# Patient Record
Sex: Female | Born: 1990 | Race: Black or African American | Hispanic: No | Marital: Single | Smoking: Never smoker
Health system: Southern US, Community
[De-identification: ages and names within clinical notes are randomized; demographics above are authoritative.]

## PROBLEM LIST (undated history)

## (undated) ENCOUNTER — Inpatient Hospital Stay (HOSPITAL_COMMUNITY): Payer: Self-pay

## (undated) DIAGNOSIS — T7840XA Allergy, unspecified, initial encounter: Secondary | ICD-10-CM

## (undated) DIAGNOSIS — G43909 Migraine, unspecified, not intractable, without status migrainosus: Secondary | ICD-10-CM

## (undated) DIAGNOSIS — N83209 Unspecified ovarian cyst, unspecified side: Secondary | ICD-10-CM

## (undated) HISTORY — PX: WISDOM TOOTH EXTRACTION: SHX21

## (undated) HISTORY — DX: Allergy, unspecified, initial encounter: T78.40XA

---
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
## 2008-05-12 ENCOUNTER — Observation Stay (HOSPITAL_COMMUNITY): Admission: EM | Admit: 2008-05-12 | Discharge: 2008-05-13 | Payer: Self-pay | Admitting: Emergency Medicine

## 2008-05-17 ENCOUNTER — Observation Stay (HOSPITAL_COMMUNITY): Admission: AD | Admit: 2008-05-17 | Discharge: 2008-05-18 | Payer: Self-pay | Admitting: Obstetrics & Gynecology

## 2009-09-20 ENCOUNTER — Inpatient Hospital Stay (HOSPITAL_COMMUNITY): Admission: AD | Admit: 2009-09-20 | Discharge: 2009-09-21 | Payer: Self-pay | Admitting: Obstetrics & Gynecology

## 2010-02-16 IMAGING — CT CT ABDOMEN W/ CM
1 of 3 series · 14 of 32 positions shown, 19 images · IV contrast (agent unspecified)
Comparison: None available.

CT ABDOMEN

CLINICAL DATA: Right side abdominal pain, nausea and vomiting.

CT ABDOMEN AND PELVIS WITH CONTRAST
TECHNIQUE: Multidetector CT imaging of the abdomen and pelvis was
performed using the standard protocol following bolus
administration of intravenous contrast.
Contrast: 100 ml Qmnipaque-0DD.

[Series 2: abd_pel 5.0 b40f st · axial · 0.59mm/px · z∈[-486,-116]mm · 14 of 84 slices shown, 19 images]
[im 5/84  soft-tissue]
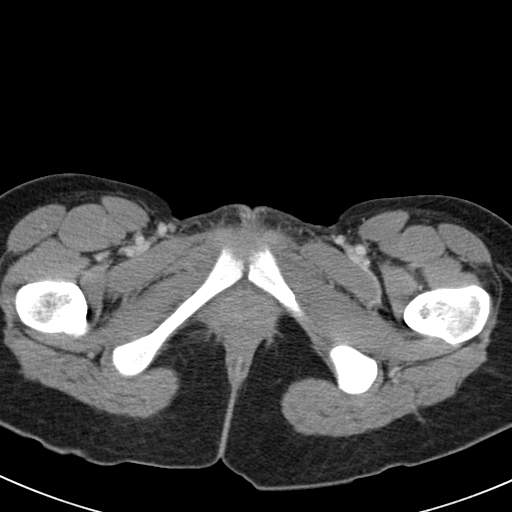
[im 5/84  bone]
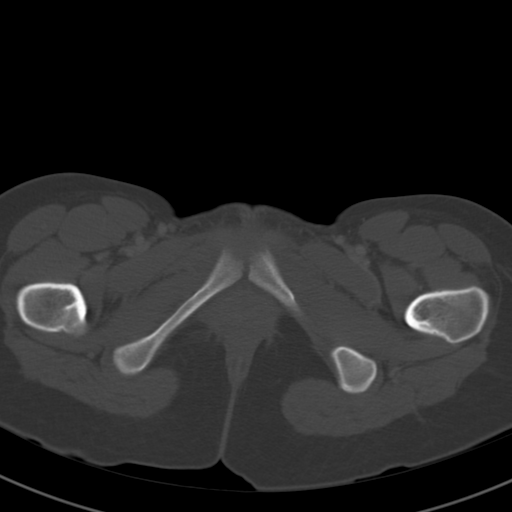
[im 14/84  soft-tissue]
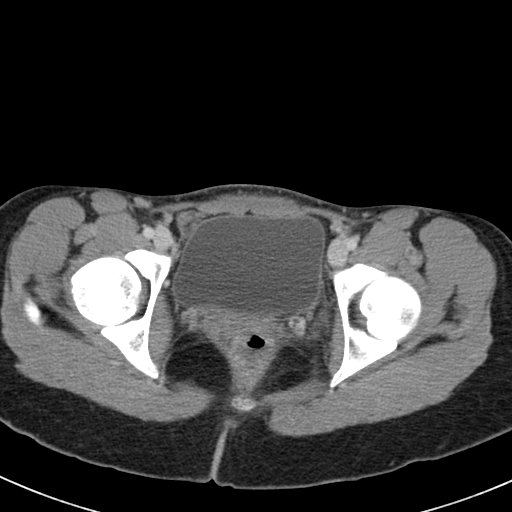
[im 18/84  soft-tissue]
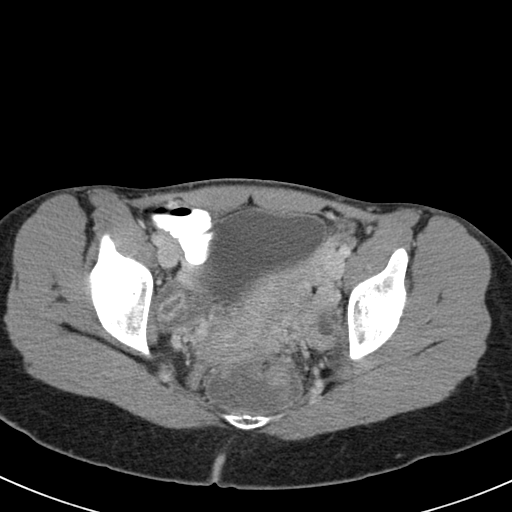
[im 22/84  soft-tissue]
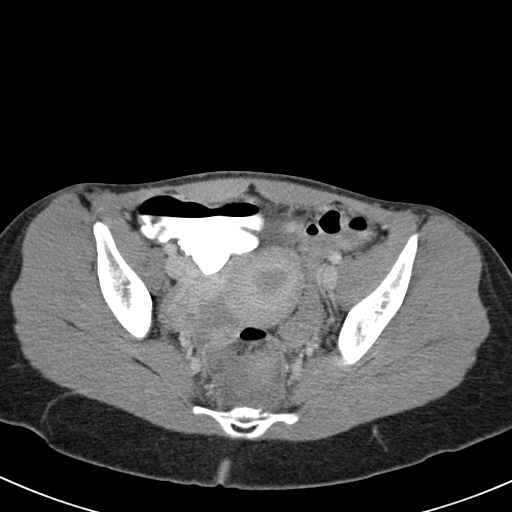
[im 31/84  soft-tissue]
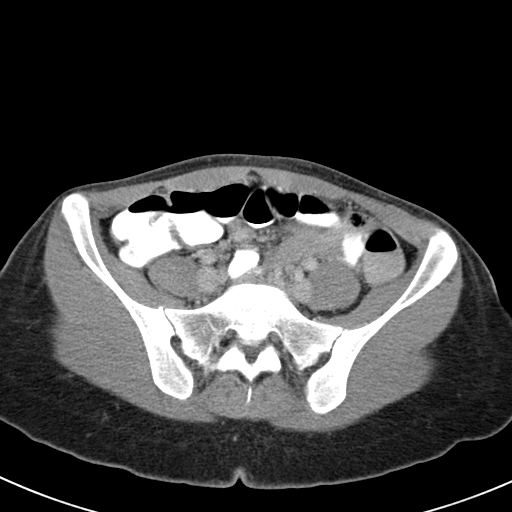
[im 35/84  soft-tissue]
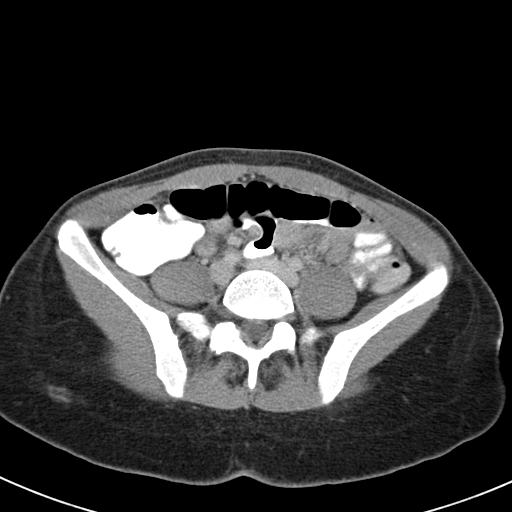
[im 44/84  soft-tissue]
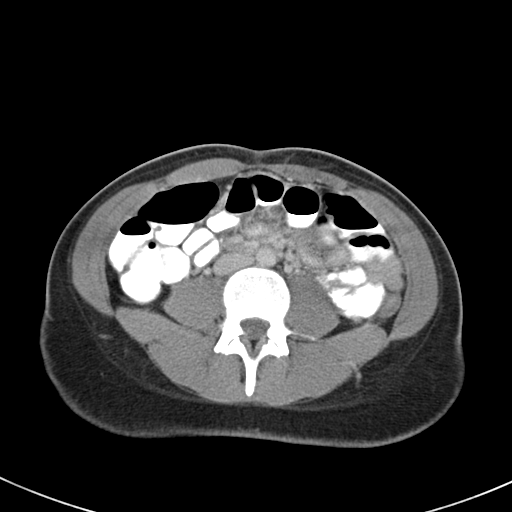
[im 49/84  soft-tissue]
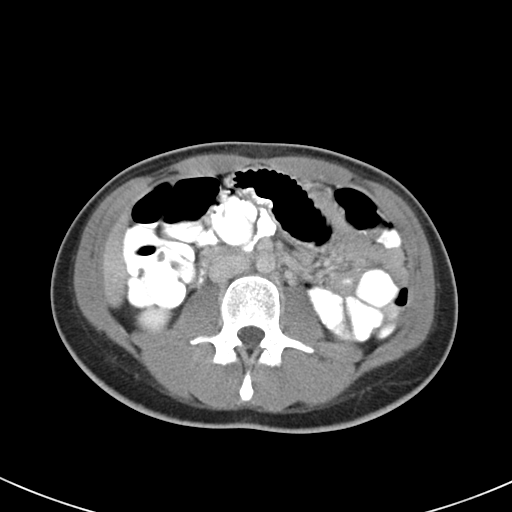
[im 53/84  soft-tissue]
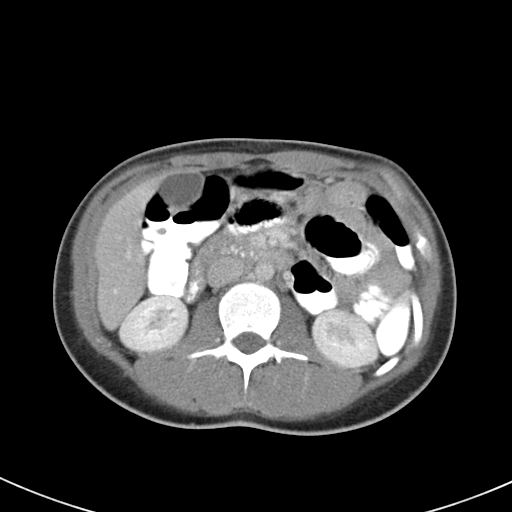
[im 53/84  bone]
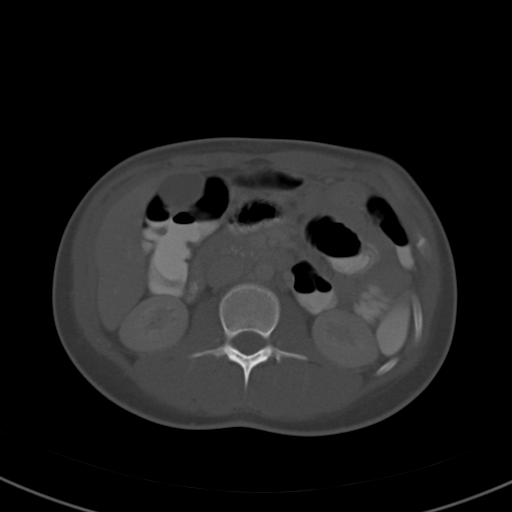
[im 62/84  soft-tissue]
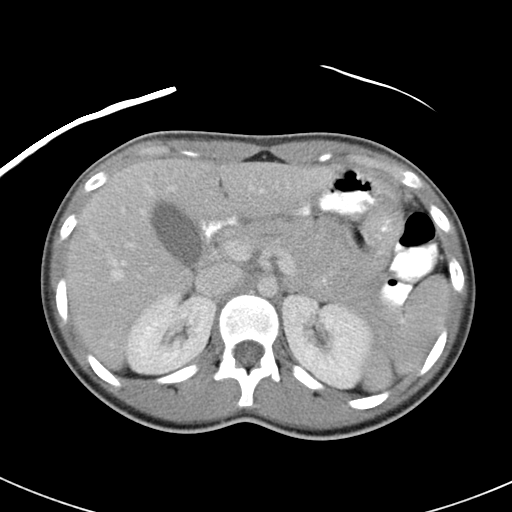
[im 66/84  soft-tissue]
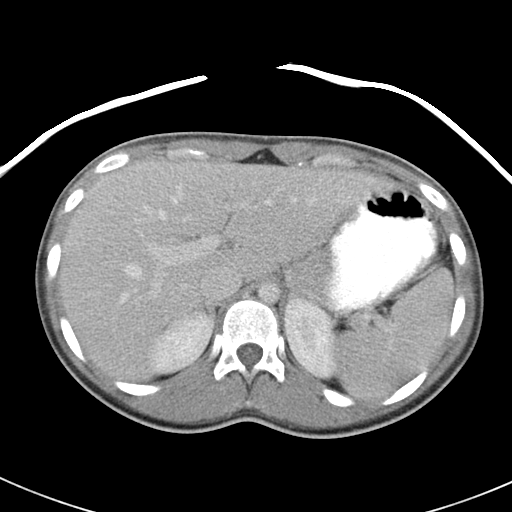
[im 66/84  lung]
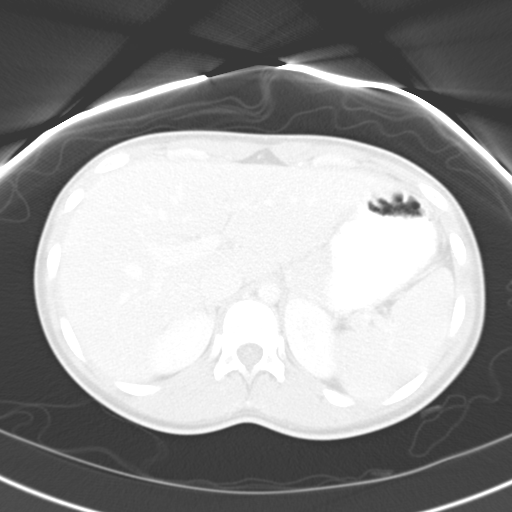
[im 70/84  soft-tissue]
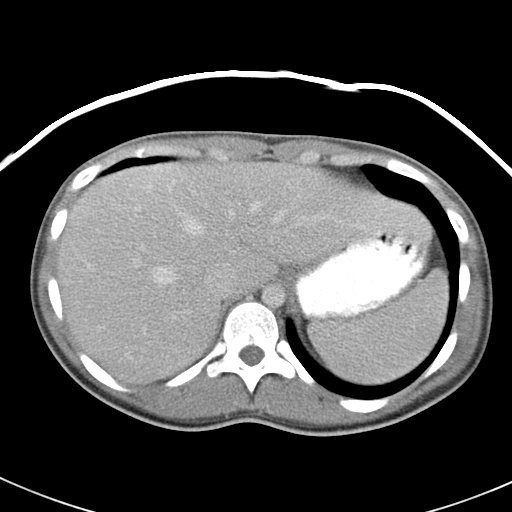
[im 70/84  lung]
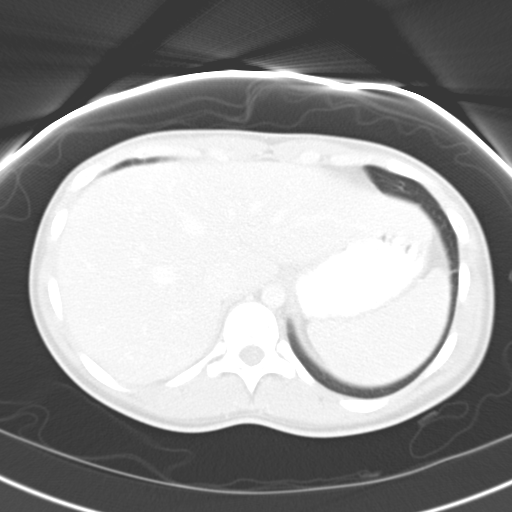
[im 75/84  lung]
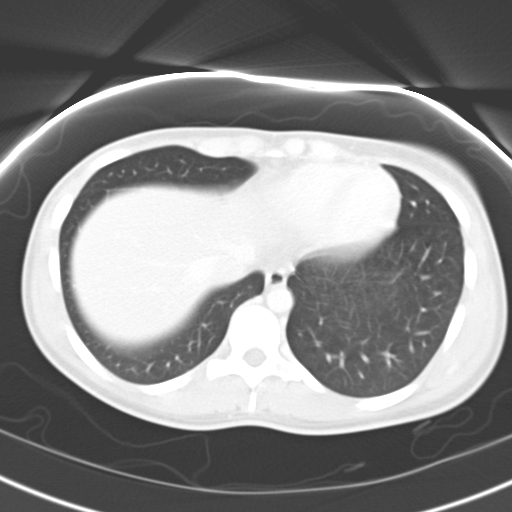
[im 79/84  soft-tissue]
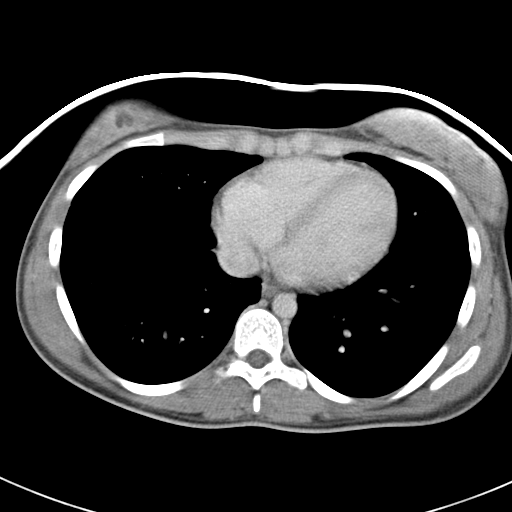
[im 79/84  lung]
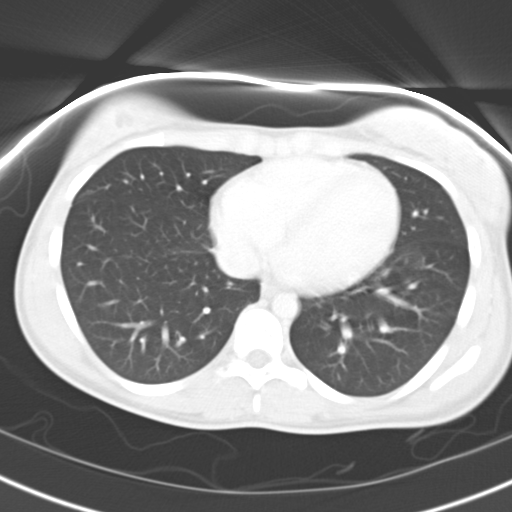

[14 of 32 positions shown; findings below may reference images not displayed]

FINDINGS: The lung bases are clear.  There is no pleural or
pericardial effusion.  The liver, gallbladder, biliary tree,
adrenal glands, spleen, pancreas and kidneys all appear normal.  No
lymphadenopathy or fluid.  No focal bony abnormality.
IMPRESSION: Negative abdomen CT scan.

CT PELVIS
FINDINGS: The appendix is partially visualized and appears normal.
The patient has a right ovarian cyst with an ill-defined
hyperattenuating ring within compatible with involution.  Left
ovary and uterus appear normal. The colon is fluid-filled but
otherwise unremarkable.  There is no free pelvic fluid and no
pelvic lymphadenopathy.  No focal bony abnormality.
IMPRESSION: 1.  Findings compatible with an involuting right ovarian cyst.
2.  Negative for appendicitis.
3.  Fluid filled colon is noted and compatible with diarrhea.

## 2010-08-01 LAB — URINALYSIS, ROUTINE W REFLEX MICROSCOPIC
Bilirubin Urine: NEGATIVE
Bilirubin Urine: NEGATIVE
Ketones, ur: >80 mg/dL — AB
Nitrite: NEGATIVE
Specific Gravity, Urine: 1.01 (ref 1.005–1.030)
Specific Gravity, Urine: 1.005 (ref 1.005–1.030)
Urobilinogen, UA: 1 mg/dL (ref 0.0–1.0)
pH: 8.5 — ABNORMAL HIGH (ref 5.0–8.0)

## 2010-08-01 LAB — BASIC METABOLIC PANEL
BUN: 5 mg/dL — ABNORMAL LOW (ref 6–23)
CO2: 23 mEq/L (ref 19–32)
Chloride: 104 mEq/L (ref 96–112)
Potassium: 3.4 mEq/L — ABNORMAL LOW (ref 3.5–5.1)

## 2010-08-01 LAB — COMPREHENSIVE METABOLIC PANEL
ALT: 18 U/L (ref 0–35)
AST: 24 U/L (ref 0–37)
Albumin: 3.5 g/dL (ref 3.5–5.2)
Alkaline Phosphatase: 52 U/L (ref 39–117)
BUN: 9 mg/dL (ref 6–23)
CO2: 18 mEq/L — ABNORMAL LOW (ref 19–32)
Calcium: 9.1 mg/dL (ref 8.4–10.5)
Chloride: 102 mEq/L (ref 96–112)
Creatinine, Ser: 0.65 mg/dL (ref 0.4–1.2)
GFR calc Af Amer: >60 mL/min (ref >60)
GFR calc non Af Amer: >60 mL/min (ref >60)
Glucose, Bld: 100 mg/dL — ABNORMAL HIGH (ref 70–99)
Potassium: 3.3 mEq/L — ABNORMAL LOW (ref 3.5–5.1)
Sodium: 132 mEq/L — ABNORMAL LOW (ref 135–145)
Total Bilirubin: 1.3 mg/dL — ABNORMAL HIGH (ref 0.3–1.2)
Total Bilirubin: 0.6 mg/dL (ref 0.3–1.2)
Total Protein: 7.4 g/dL (ref 6.0–8.3)

## 2010-08-01 LAB — DIFFERENTIAL
Basophils Absolute: 0 10*3/uL (ref 0.0–0.1)
Basophils Absolute: 0.1 10*3/uL (ref 0.0–0.1)
Basophils Relative: 0 % (ref 0–1)
Basophils Relative: 0 % (ref 0–1)
Eosinophils Absolute: 0 10*3/uL (ref 0.0–0.7)
Eosinophils Absolute: 0 10*3/uL (ref 0.0–0.7)
Eosinophils Relative: 0 % (ref 0–5)
Eosinophils Relative: 0 % (ref 0–5)
Lymphocytes Relative: 12 % (ref 12–46)
Lymphs Abs: 1.1 10*3/uL (ref 0.7–4.0)
Lymphs Abs: 2.1 10*3/uL (ref 0.7–4.0)
Lymphs Abs: 2.1 10*3/uL (ref 0.7–4.0)
Monocytes Absolute: 0.7 10*3/uL (ref 0.1–1.0)
Monocytes Relative: 3 % (ref 3–12)
Monocytes Relative: 4 % (ref 3–12)
Neutro Abs: 14 10*3/uL — ABNORMAL HIGH (ref 1.7–7.7)
Neutrophils Relative %: 87 % — ABNORMAL HIGH (ref 43–77)

## 2010-08-01 LAB — WET PREP, GENITAL: Trich, Wet Prep: NONE SEEN

## 2010-08-01 LAB — CBC
HCT: 32.4 % — ABNORMAL LOW (ref 36.0–46.0)
HCT: 35.7 % — ABNORMAL LOW (ref 36.0–46.0)
Hemoglobin: 12.5 g/dL (ref 12.0–15.0)
MCHC: 33 g/dL (ref 30.0–36.0)
MCHC: 33.6 g/dL (ref 30.0–36.0)
MCHC: 33.5 g/dL (ref 30.0–36.0)
MCV: 90.2 fL (ref 78.0–100.0)
MCV: 89.1 fL (ref 78.0–100.0)
Platelets: 169 10*3/uL (ref 150–400)
Platelets: 273 10*3/uL (ref 150–400)
RBC: 3.59 MIL/uL — ABNORMAL LOW (ref 3.87–5.11)
RBC: 4.18 MIL/uL (ref 3.87–5.11)
RDW: 13.5 % (ref 11.5–15.5)
WBC: 13.1 10*3/uL — ABNORMAL HIGH (ref 4.0–10.5)
WBC: 19.4 10*3/uL — ABNORMAL HIGH (ref 4.0–10.5)

## 2010-08-01 LAB — LIPASE, BLOOD: Lipase: 25 U/L (ref 11–59)

## 2010-08-01 LAB — URINE MICROSCOPIC-ADD ON

## 2010-08-01 LAB — GC/CHLAMYDIA PROBE AMP, GENITAL: Chlamydia, DNA Probe: NEGATIVE

## 2010-08-01 LAB — PREGNANCY, URINE: Preg Test, Ur: NEGATIVE

## 2010-08-02 LAB — DIFFERENTIAL
Eosinophils Relative: 0 % (ref 0–5)
Lymphocytes Relative: 9 % — ABNORMAL LOW (ref 12–46)
Lymphs Abs: 1.5 10*3/uL (ref 0.7–4.0)
Monocytes Absolute: 0.8 10*3/uL (ref 0.1–1.0)
Monocytes Relative: 5 % (ref 3–12)

## 2010-08-02 LAB — RPR: RPR Ser Ql: NONREACTIVE

## 2010-08-02 LAB — CBC
HCT: 30.2 % — ABNORMAL LOW (ref 36.0–46.0)
Hemoglobin: 10.1 g/dL — ABNORMAL LOW (ref 12.0–15.0)
RDW: 13.4 % (ref 11.5–15.5)
WBC: 16.4 10*3/uL — ABNORMAL HIGH (ref 4.0–10.5)

## 2010-09-02 NOTE — Op Note (Signed)
[facility]  Patient:    Kristy Davis, Kristy Davis                     MRN: [id number] Adm. Date:  [id number] Attending:  Kendell Bane CC:         Moshe Salisbury. Audria Nine, M.D.   Operative Report  PREOPERATIVE DIAGNOSIS:  Volar ganglion left wrist.  POSTOPERATIVE DIAGNOSIS:  Volar ganglion left wrist.  PROCEDURE:  Excision of volar ganglion left wrist.  SURGEON:  Lowell Bouton, M.D.  ANESTHESIA:  General.  OPERATIVE FINDINGS:  The patient had a 1.5 cm volar ganglion that arose along the scaphoid tuberosity and the flexor carpi radialis sheath.  DESCRIPTION OF PROCEDURE:  Under general anesthesia, with a tourniquet on the left arm, the left hand was prepped and draped in the usual fashion and after exsanguinating the limb the tourniquet was inflated to 225 mmHg.  A V-shaped incision was made over the volar-radial aspect of the left wrist.  Sharp dissection was carried through the subcutaneous tissues and bleeding points were coagulated.  Blunt dissection was carried down to the mass and the mass was dissected out bluntly.  After completely dissecting out the mass from the flexor carpi radialis sheath, the stalk was transected sharply.  After removing the ganglion, the stalk was curetted out and was found to go down along the scaphoid tuberosity.  The wound was then irrigated copiously with saline.  A Vesi-loop drain was left in for drainage and the skin was closed with a 4-0 subcuticular Prolene.  Steri-Strips were applied, followed by sterile dressings and a volar wrist splint.  The tourniquet was released with good circulation of the hand.  The patient went to the recovery awake and stable and in good condition. DD:  10/17/00 TD:  10/17/00 Job: [id number]

## 2010-11-23 ENCOUNTER — Inpatient Hospital Stay (HOSPITAL_COMMUNITY)
Admission: AD | Admit: 2010-11-23 | Discharge: 2010-11-23 | Disposition: A | Discharge status: Home / Self Care | Payer: 59 | Source: Ambulatory Visit | Attending: Obstetrics and Gynecology | Admitting: Obstetrics and Gynecology

## 2010-11-23 ENCOUNTER — Inpatient Hospital Stay (HOSPITAL_COMMUNITY): Payer: 59

## 2010-11-23 DIAGNOSIS — N7011 Chronic salpingitis: Secondary | ICD-10-CM | POA: Diagnosis present

## 2010-11-23 DIAGNOSIS — R1031 Right lower quadrant pain: Secondary | ICD-10-CM | POA: Insufficient documentation

## 2010-11-23 DIAGNOSIS — N7013 Chronic salpingitis and oophoritis: Secondary | ICD-10-CM

## 2010-11-23 LAB — POCT PREGNANCY, URINE: Preg Test, Ur: NEGATIVE

## 2010-11-23 LAB — WET PREP, GENITAL
Clue Cells Wet Prep HPF POC: NONE SEEN
Trich, Wet Prep: NONE SEEN
Yeast Wet Prep HPF POC: NONE SEEN

## 2010-11-23 LAB — URINALYSIS, ROUTINE W REFLEX MICROSCOPIC
Bilirubin Urine: NEGATIVE
Glucose, UA: NEGATIVE mg/dL
Ketones, ur: NEGATIVE mg/dL
Leukocytes, UA: NEGATIVE
Nitrite: NEGATIVE
Protein, ur: NEGATIVE mg/dL
Specific Gravity, Urine: 1.025 (ref 1.005–1.030)
Urobilinogen, UA: 2 mg/dL — ABNORMAL HIGH (ref 0.0–1.0)
pH: 6 (ref 5.0–8.0)

## 2010-11-23 LAB — CBC
HCT: 35.5 % — ABNORMAL LOW (ref 36.0–46.0)
Hemoglobin: 11.7 g/dL — ABNORMAL LOW (ref 12.0–15.0)
MCH: 29.4 pg (ref 26.0–34.0)
MCHC: 33 g/dL (ref 30.0–36.0)
MCV: 89.2 fL (ref 78.0–100.0)
Platelets: 201 10*3/uL (ref 150–400)
RBC: 3.98 MIL/uL (ref 3.87–5.11)
RDW: 12.9 % (ref 11.5–15.5)
WBC: 6 10*3/uL (ref 4.0–10.5)

## 2010-11-23 LAB — URINE MICROSCOPIC-ADD ON

## 2010-11-23 MED ORDER — HYDROCODONE-ACETAMINOPHEN 5-500 MG PO TABS: 1.0000 | ORAL_TABLET | Freq: Four times a day (QID) | ORAL | Status: AC | PRN

## 2010-11-23 NOTE — Progress Notes (Signed)
Patient states pain started last night when she had a bowel movement.  Right upper quadrant abdomen with sharp pains that started today, constant.

## 2010-11-23 NOTE — ED Provider Notes (Signed)
History     Chief Complaint  Patient presents with  . Abdominal Pain   HPI 20 y.o. with RLQ pain, constant since last night, started with BM, sharp. No vaginal bleeding or discharge. Normal BMs, no n/v. No fever, chills, malaise.   OB History    No data available      No past medical history on file.  No past surgical history on file.  No family history on file.  History  Substance Use Topics  . Smoking status: Not on file  . Smokeless tobacco: Not on file  . Alcohol Use: Not on file    Allergies: No Known Allergies  No prescriptions prior to admission    Review of Systems  Constitutional: Negative.   Respiratory: Negative.   Cardiovascular: Negative.   Gastrointestinal: Positive for abdominal pain. Negative for nausea, vomiting, diarrhea and constipation.  Genitourinary: Negative for dysuria, urgency, frequency, hematuria and flank pain.       Negative for vaginal bleeding, vaginal discharge, positive for pain  Musculoskeletal: Negative.   Neurological: Negative.   Psychiatric/Behavioral: Negative.    Physical Exam   Blood pressure 114/72, pulse 72, temperature 98.2 F (36.8 C), resp. rate 16, height 5\' 3"  (1.6 m), weight 53.797 kg (118 lb 9.6 oz), last menstrual period 11/15/2010.  Physical Exam  Constitutional: She is oriented to person, place, and time. She appears well-developed and well-nourished. No distress.  HENT:  Head: Normocephalic and atraumatic.  Cardiovascular: Normal rate, regular rhythm and normal heart sounds.   Respiratory: Effort normal and breath sounds normal. No respiratory distress.  GI: Soft. Bowel sounds are normal. She exhibits no distension and no mass. There is no tenderness. There is no rebound and no guarding.  Genitourinary: There is no rash or lesion on the right labia. There is no rash or lesion on the left labia. Uterus is not deviated, not enlarged, not fixed and not tender. Cervix exhibits no motion tenderness, no discharge  and no friability. Right adnexum displays mass and tenderness. Right adnexum displays no fullness. Left adnexum displays no mass, no tenderness and no fullness. No erythema, tenderness or bleeding around the vagina. Vaginal discharge (creamy white) found.  Neurological: She is alert and oriented to person, place, and time.  Skin: Skin is warm and dry.  Psychiatric: She has a normal mood and affect.    MAU Course  Procedures US Transvaginal Non-ob  11/23/2010  *RADIOLOGY REPORT*  Clinical Data: Right adnexal tenderness/pain.  TRANSABDOMINAL AND TRANSVAGINAL ULTRASOUND OF PELVIS Technique:  Both transabdominal and transvaginal ultrasound examinations of the pelvis were performed. Transabdominal technique was performed for global imaging of the pelvis including uterus, ovaries, adnexal regions, and pelvic cul-de-sac.  Comparison: None.   It was necessary to proceed with endovaginal exam following the transabdominal exam to visualize the endometrium.  Findings:  Uterus: Normal in size and appearance, measuring 6.1 x 3.3 x 4.1 cm  Endometrium: Normal in thickness and appearance, measuring 6 mm  Right ovary:  Normal appearance/no adnexal mass, measuring 1.8 x 2.6 x 2.4 cm.  Adjacent dilated tubular structure, suspicious for hydrosalpinx.  Left ovary: Normal appearance/no adnexal mass, measuring 3.3 x 1.5 x 2.1 cm  Other findings: Small volume free fluid  IMPRESSION: Dilated tubular structure in the right adnexa, suspicious for hydrosalpinx.  Otherwise normal sonographic appearance of the uterus and bilateral ovaries.  Original Report Authenticated By: Charline Bills, M.D.   Consult with Dr. Jolayne Panther - rec pain mgmt and f/u in GYN clinic in 1  month.   After discussing with pt, pt revealed that she is a patient at [facility], where she sees Arlana Lindau, NP, and she would like to follow up there  Assessment and Plan  20 y.o. with right hydrosalpinx Rx vicodin #30 Call [facility] for  follow-up   Coree Riester 11/23/2010, 10:47 PM

## 2010-11-23 NOTE — Progress Notes (Signed)
Patient c/o suprapubic pain has had history of UTI, no vaginal discharge, LMP 11/15/10

## 2010-11-29 NOTE — ED Provider Notes (Signed)
Agree with above note.  Kristy Davis 11/29/2010 8:45 AM

## 2011-03-11 ENCOUNTER — Inpatient Hospital Stay (HOSPITAL_COMMUNITY): Payer: 59

## 2011-03-11 ENCOUNTER — Encounter (HOSPITAL_COMMUNITY): Payer: Self-pay | Admitting: *Deleted

## 2011-03-11 ENCOUNTER — Inpatient Hospital Stay (HOSPITAL_COMMUNITY)
Admission: AD | Admit: 2011-03-11 | Discharge: 2011-03-11 | Disposition: A | Discharge status: Home / Self Care | Payer: 59 | Source: Ambulatory Visit | Attending: Obstetrics | Admitting: Obstetrics

## 2011-03-11 DIAGNOSIS — R109 Unspecified abdominal pain: Secondary | ICD-10-CM | POA: Insufficient documentation

## 2011-03-11 LAB — URINE MICROSCOPIC-ADD ON

## 2011-03-11 LAB — URINALYSIS, ROUTINE W REFLEX MICROSCOPIC
Ketones, ur: >80 mg/dL — AB
Nitrite: NEGATIVE
Protein, ur: NEGATIVE mg/dL
Urobilinogen, UA: 1 mg/dL (ref 0.0–1.0)

## 2011-03-11 LAB — POCT PREGNANCY, URINE: Preg Test, Ur: NEGATIVE

## 2011-03-11 MED ORDER — METRONIDAZOLE 500 MG PO TABS: 500.0000 mg | ORAL_TABLET | Freq: Two times a day (BID) | ORAL | Status: AC

## 2011-03-11 MED ORDER — OXYCODONE-ACETAMINOPHEN 5-325 MG PO TABS: 1.0000 | ORAL_TABLET | ORAL | Status: AC | PRN

## 2011-03-11 NOTE — Progress Notes (Signed)
G 0. LMP 02/16/2011. Pain lower abd since Weds. Denies vag bleeding or d/c. Some nausea but no emesis or diarrhea. Crampy pains in abd with intermittent sharp pains in abd and back

## 2011-03-11 NOTE — Progress Notes (Signed)
Dr Ernestina Penna notified of pt's admission and status. Aware of neg upt and u/a results. Will get pelvic u/s and urine for culture.

## 2011-03-11 NOTE — H&P (Signed)
CC: abd pain HPI: 20 yo NP pt w/ abdominal pain since Wed. Got worse last night and decided to come for eval. LMP 11/6, normal per pt. No VB at this time, no vag d/c, no fevers, no emesis, no nausea at this time (has nausea earlier when decided to come in). Has not taken anything for pain (no OTC pain meds). No aggravating/ alleviating factors. Pt does say moving makes pain worse. Pt states last BM yest and thinks she may be constipation. Pt notes sharp pain in back with urination. Pt states she has had UTI in past but this feels different. Sexually active, condoms for birth control. Pt states pain is both constant and comes and goes.  All: none Meds: none PMH: none PSH: none  PE:  Filed Vitals:   03/11/11 0203  BP: 119/65  Pulse: 112  Temp: 98.8 F (37.1 C)  TempSrc: Oral  Resp: 18  Height: 5' 3.25" (1.607 m)  Weight: 52.844 kg (116 lb 8 oz)   Gen: overall well w/ no distress though at times w/ movement holds abdomen CV:RRR,, no longer tachycardic Pulm: CTAB Back: no CVAT Abd: mild tenderness throughout, no rebound, no guarding LE: NT, no edema GU: nl cvx, no d/c, no bleeding, L adnexal tenderness and mobile cystic 4 cm mass, no true CMT though pt w/ discomfort during entire pelvic exam, no bladder tenderness  Wet prep: numerous clue cells noted, no lactobacilli, no yeast, 5 WBC/ hpf  UA: contaminated  U/s : 3.6 cm L hemorrhagic cyst  A/P: Abdominal pain, acute in nature but not w/ a surgical abdomen, likely from hemorrhagic cyst - Hemorrhagic cyst. D/w pt. Plan po pain meds at home and re-eval in 1 wk. D/w pt importance of returning w/ worsening pain, si/sx of anemia, increased N/V. Can consider OC for cyst suppression in this sexually active pt using condoms. UCx and GC/CT pending.  - BV. Flagyl given  Kristy Davis A. 03/11/2011 8:08 AM

## 2011-03-11 NOTE — Progress Notes (Signed)
Pt states, " I 've had low abdominal pain since Wed evening. It first started off like gas, and I took gas pills since Thursday night and it didn't work."

## 2011-03-12 LAB — URINE CULTURE
Colony Count: 6000
Culture  Setup Time: 201211241106

## 2011-03-13 LAB — GC/CHLAMYDIA PROBE AMP, GENITAL: GC Probe Amp, Genital: NEGATIVE

## 2011-11-02 ENCOUNTER — Ambulatory Visit (INDEPENDENT_AMBULATORY_CARE_PROVIDER_SITE_OTHER): Payer: 59 | Admitting: Family Medicine

## 2011-11-02 VITALS — BP 124/68 | HR 83 | Temp 98.0°F | Resp 18 | Ht 63.0 in | Wt 124.0 lb

## 2011-11-02 DIAGNOSIS — R05 Cough: Secondary | ICD-10-CM

## 2011-11-02 DIAGNOSIS — J069 Acute upper respiratory infection, unspecified: Secondary | ICD-10-CM

## 2011-11-02 DIAGNOSIS — J3489 Other specified disorders of nose and nasal sinuses: Secondary | ICD-10-CM

## 2011-11-02 MED ORDER — AMOXICILLIN 875 MG PO TABS: 875.0000 mg | ORAL_TABLET | Freq: Two times a day (BID) | ORAL | Status: AC

## 2011-11-02 NOTE — Progress Notes (Signed)
Subjective:    Patient ID: Kristy Davis, female    DOB: 01-03-1991, 21 y.o.   MRN: [id number]  HPI Kristy Davis is a 21 y.o. female Sinus pressure, and pain in whole head.  Ears clogged, nose stuffed, neck/glands sore.  No sore throat.  No fever. Face feels hot. Some cough - yellow at times with sputum.    [employer], infant room.  Nonsmoker.   Generic allergy med - otc - took one.   Review of Systems Per hpi.     Objective:   Physical Exam  Constitutional: She is oriented to person, place, and time. She appears well-developed and well-nourished. No distress.  HENT:  Head: Normocephalic and atraumatic.  Right Ear: Hearing, tympanic membrane, external ear and ear canal normal.  Left Ear: Hearing, tympanic membrane, external ear and ear canal normal.  Nose: Right sinus exhibits no maxillary sinus tenderness and no frontal sinus tenderness. Left sinus exhibits maxillary sinus tenderness. Left sinus exhibits no frontal sinus tenderness.  Mouth/Throat: Oropharynx is clear and moist. No oropharyngeal exudate.  Eyes: Conjunctivae and EOM are normal. Pupils are equal, round, and reactive to light.  Neck: Normal range of motion.  Cardiovascular: Normal rate, regular rhythm, normal heart sounds and intact distal pulses.   No murmur heard. Pulmonary/Chest: Effort normal and breath sounds normal. No respiratory distress. She has no wheezes. She has no rhonchi.  Lymphadenopathy:    She has no cervical adenopathy.  Neurological: She is alert and oriented to person, place, and time.  Skin: Skin is warm and dry. No rash noted.  Psychiatric: She has a normal mood and affect. Her behavior is normal.          Assessment & Plan:  Kristy Davis is a 21 y.o. female 1. Sinus pressure  amoxicillin (AMOXIL) 875 MG tablet  2. URI (upper respiratory infection)    3. Cough     Likely viral URI with sinus pressure.  Sx care as below and rtc precautions.  Patient Instructions    Saline nasal spray atleast 4 times per day, over the counter mucinex or mucinex DM, drink plenty of fluids. Wash hands frequently at work, avoid close contact and touching face. If sinus pressure/congestion not improving in the next 7 to 10 days, can start antibiotic for a possible sinus infection. Return to the clinic or go to the nearest emergency room if any of your symptoms worsen or new symptoms occur.   Upper Respiratory Infection, Adult An upper respiratory infection (URI) is also known as the common cold. It is often caused by a type of germ (virus). Colds are easily spread (contagious). You can pass it to others by kissing, coughing, sneezing, or drinking out of the same glass. Usually, you get better in 1 or 2 weeks.  HOME CARE   Only take medicine as told by your doctor.   Use a warm mist humidifier or breathe in steam from a hot shower.   Drink enough water and fluids to keep your pee (urine) clear or pale yellow.   Get plenty of rest.   Return to work when your temperature is back to normal or as told by your doctor. You may use a face mask and wash your hands to stop your cold from spreading.  GET HELP RIGHT AWAY IF:   After the first few days, you feel you are getting worse.   You have questions about your medicine.   You have chills, shortness of breath,  or brown or red spit (mucus).   You have yellow or brown snot (nasal discharge) or pain in the face, especially when you bend forward.   You have a fever, puffy (swollen) neck, pain when you swallow, or white spots in the back of your throat.   You have a bad headache, ear pain, sinus pain, or chest pain.   You have a high-pitched whistling sound when you breathe in and out (wheezing).   You have a lasting cough or cough up blood.   You have sore muscles or a stiff neck.  MAKE SURE YOU:   Understand these instructions.   Will watch your condition.   Will get help right away if you are not doing well or get  worse.  Document Released: 09/20/2007 Document Revised: 03/23/2011 Document Reviewed: 08/08/2010 [facility] Patient Information 2012 [place], [place].

## 2011-11-02 NOTE — Patient Instructions (Signed)
Saline nasal spray atleast 4 times per day, over the counter mucinex or mucinex DM, drink plenty of fluids. Wash hands frequently at work, avoid close contact and touching face. If sinus pressure/congestion not improving in the next 7 to 10 days, can start antibiotic for a possible sinus infection. Return to the clinic or go to the nearest emergency room if any of your symptoms worsen or new symptoms occur.   Upper Respiratory Infection, Adult An upper respiratory infection (URI) is also known as the common cold. It is often caused by a type of germ (virus). Colds are easily spread (contagious). You can pass it to others by kissing, coughing, sneezing, or drinking out of the same glass. Usually, you get better in 1 or 2 weeks.  HOME CARE   Only take medicine as told by your doctor.   Use a warm mist humidifier or breathe in steam from a hot shower.   Drink enough water and fluids to keep your pee (urine) clear or pale yellow.   Get plenty of rest.   Return to work when your temperature is back to normal or as told by your doctor. You may use a face mask and wash your hands to stop your cold from spreading.  GET HELP RIGHT AWAY IF:   After the first few days, you feel you are getting worse.   You have questions about your medicine.   You have chills, shortness of breath, or brown or red spit (mucus).   You have yellow or brown snot (nasal discharge) or pain in the face, especially when you bend forward.   You have a fever, puffy (swollen) neck, pain when you swallow, or white spots in the back of your throat.   You have a bad headache, ear pain, sinus pain, or chest pain.   You have a high-pitched whistling sound when you breathe in and out (wheezing).   You have a lasting cough or cough up blood.   You have sore muscles or a stiff neck.  MAKE SURE YOU:   Understand these instructions.   Will watch your condition.   Will get help right away if you are not doing well or get  worse.  Document Released: 09/20/2007 Document Revised: 03/23/2011 Document Reviewed: 08/08/2010 [facility] Patient Information 2012 [place], [place].

## 2012-02-09 ENCOUNTER — Emergency Department (HOSPITAL_COMMUNITY)
Admission: EM | Admit: 2012-02-09 | Discharge: 2012-02-09 | Disposition: A | Discharge status: Home / Self Care | Payer: 59 | Attending: Emergency Medicine | Admitting: Emergency Medicine

## 2012-02-09 ENCOUNTER — Encounter (HOSPITAL_COMMUNITY): Payer: Self-pay | Admitting: Emergency Medicine

## 2012-02-09 ENCOUNTER — Emergency Department (HOSPITAL_COMMUNITY): Payer: 59

## 2012-02-09 DIAGNOSIS — R079 Chest pain, unspecified: Secondary | ICD-10-CM | POA: Insufficient documentation

## 2012-02-09 LAB — BASIC METABOLIC PANEL
Calcium: 8.5 mg/dL (ref 8.4–10.5)
GFR calc Af Amer: >90 mL/min (ref >90)
GFR calc non Af Amer: >90 mL/min (ref >90)
Glucose, Bld: 90 mg/dL (ref 70–99)
Potassium: 3.8 mEq/L (ref 3.5–5.1)
Sodium: 135 mEq/L (ref 135–145)

## 2012-02-09 LAB — CBC
Hemoglobin: 11.5 g/dL — ABNORMAL LOW (ref 12.0–15.0)
MCHC: 33 g/dL (ref 30.0–36.0)
Platelets: 222 10*3/uL (ref 150–400)
RDW: 12.7 % (ref 11.5–15.5)

## 2012-02-09 LAB — POCT I-STAT TROPONIN I: Troponin i, poc: 0.01 ng/mL (ref 0.00–0.08)

## 2012-02-09 LAB — POCT PREGNANCY, URINE: Preg Test, Ur: NEGATIVE

## 2012-02-09 NOTE — ED Notes (Addendum)
Pt reports having L side CP (under L breast towards sternum) X1 month; denies SOB, n/v; denies pain currently

## 2012-02-09 NOTE — ED Provider Notes (Signed)
History     CSN: [id number]  Arrival date & time 02/09/12  8295   First MD Initiated Contact with Patient 02/09/12 2006      Chief Complaint  Patient presents with  . Chest Pain    (Consider location/radiation/quality/duration/timing/severity/associated sxs/prior treatment) HPI... chest pain for approximately one month, described as sharp. Pain is located in the inferior medial left chest area, is intermittent, 2 episodes per day, each lasting about 1 minute. No dyspnea, diaphoresis, nausea. No smoking. No family history. No alcohol. Nothing makes symptoms better or worse  Past Medical History  Diagnosis Date  . No pertinent past medical history     Past Surgical History  Procedure Date  . No past surgeries     History reviewed. No pertinent family history.  History  Substance Use Topics  . Smoking status: Never Smoker   . Smokeless tobacco: Not on file  . Alcohol Use: Yes     occasion    OB History    Grav Para Term Preterm Abortions TAB SAB Ect Mult Living   0               Review of Systems  All other systems reviewed and are negative.    Allergies  Review of patient's allergies indicates no known allergies.  Home Medications   Current Outpatient Rx  Name Route Sig Dispense Refill  . NORGESTIM-ETH ESTRAD TRIPHASIC 0.18/0.215/0.25 MG-25 MCG PO TABS Oral Take 1 tablet by mouth daily.      BP 112/64  Pulse 71  Temp 97.3 F (36.3 C) (Oral)  Resp 20  SpO2 100%  LMP 01/03/2012  Physical Exam  Nursing note and vitals reviewed. Constitutional: She is oriented to person, place, and time. She appears well-developed and well-nourished.  HENT:  Head: Normocephalic and atraumatic.  Eyes: Conjunctivae normal and EOM are normal. Pupils are equal, round, and reactive to light.  Neck: Normal range of motion. Neck supple.  Cardiovascular: Normal rate, regular rhythm and normal heart sounds.   Pulmonary/Chest: Effort normal and breath sounds normal.    Abdominal: Soft. Bowel sounds are normal.  Musculoskeletal: Normal range of motion.  Neurological: She is alert and oriented to person, place, and time.  Skin: Skin is warm and dry.  Psychiatric: She has a normal mood and affect.    ED Course  Procedures (including critical care time)  Labs Reviewed  CBC - Abnormal; Notable for the following:    Hemoglobin 11.5 (*)     HCT 34.9 (*)     All other components within normal limits  BASIC METABOLIC PANEL  POCT PREGNANCY, URINE  POCT I-STAT TROPONIN I   Dg Chest 2 View  02/09/2012  *RADIOLOGY REPORT*  Clinical Data: Chest pain  CHEST - 2 VIEW  Comparison: 05/12/2008  Findings: Heart size appears normal.  No pleural effusion or edema. No airspace consolidation.  IMPRESSION:  1.  No active cardiopulmonary abnormalities.   Original Report Authenticated By: Rosealee Albee, M.D.     Date: 02/09/2012  Rate: 68  Rhythm: normal sinus rhythm  QRS Axis: right  Intervals: normal  ST/T Wave abnormalities: normal  Conduction Disutrbances: none  Narrative Interpretation: unremarkable     1. Chest pain       MDM  Patient is low risk for acute coronary syndrome or pulmonary embolus. Screening tests negative.        Donnetta Hutching, MD 02/09/12 2213

## 2012-05-24 ENCOUNTER — Emergency Department (HOSPITAL_COMMUNITY)
Admission: EM | Admit: 2012-05-24 | Discharge: 2012-05-24 | Disposition: A | Discharge status: Home / Self Care | Payer: 59 | Attending: Emergency Medicine | Admitting: Emergency Medicine

## 2012-05-24 ENCOUNTER — Encounter (HOSPITAL_COMMUNITY): Payer: Self-pay | Admitting: Emergency Medicine

## 2012-05-24 DIAGNOSIS — Z79899 Other long term (current) drug therapy: Secondary | ICD-10-CM | POA: Insufficient documentation

## 2012-05-24 DIAGNOSIS — G43909 Migraine, unspecified, not intractable, without status migrainosus: Secondary | ICD-10-CM | POA: Insufficient documentation

## 2012-05-24 DIAGNOSIS — H53149 Visual discomfort, unspecified: Secondary | ICD-10-CM | POA: Insufficient documentation

## 2012-05-24 DIAGNOSIS — R110 Nausea: Secondary | ICD-10-CM | POA: Insufficient documentation

## 2012-05-24 DIAGNOSIS — R51 Headache: Secondary | ICD-10-CM | POA: Insufficient documentation

## 2012-05-24 HISTORY — DX: Migraine, unspecified, not intractable, without status migrainosus: G43.909

## 2012-05-24 MED ORDER — DIPHENHYDRAMINE HCL 50 MG/ML IJ SOLN
25.0000 mg | Freq: Once | INTRAMUSCULAR | Status: AC
  Administered 2012-05-24: 25 mg via INTRAVENOUS
  Filled 2012-05-24: qty 1

## 2012-05-24 MED ORDER — KETOROLAC TROMETHAMINE 30 MG/ML IJ SOLN
30.0000 mg | Freq: Once | INTRAMUSCULAR | Status: AC
  Administered 2012-05-24: 30 mg via INTRAVENOUS
  Filled 2012-05-24: qty 1

## 2012-05-24 MED ORDER — METOCLOPRAMIDE HCL 5 MG/ML IJ SOLN
10.0000 mg | Freq: Once | INTRAMUSCULAR | Status: AC
  Administered 2012-05-24: 10 mg via INTRAVENOUS
  Filled 2012-05-24: qty 2

## 2012-05-24 NOTE — ED Provider Notes (Signed)
  Medical screening examination/treatment/procedure(s) were performed by non-physician practitioner and as supervising physician I was immediately available for consultation/collaboration.    Lorianne Malbrough, MD 05/24/12 2354 

## 2012-05-24 NOTE — ED Notes (Signed)
Pt c/o migraine HA in left eye starting today with blurry vision, nausea and photophobia; pt sts hx of migraine

## 2012-05-24 NOTE — ED Provider Notes (Signed)
History     CSN: [id number]  Arrival date & time 05/24/12  1425   First MD Initiated Contact with Patient 05/24/12 1619      Chief Complaint  Patient presents with  . Migraine    (Consider location/radiation/quality/duration/timing/severity/associated sxs/prior treatment) HPI Comments: Patient with a history of Migraines presents today with a chief complaint of a migraine headache.  She reports that the headache feels similar to migraine headaches that she has had in the past.  She reports that the pain began around noon today.  Pain has been persistent.  She took Ibuprofen for her pain, which helped somewhat but she continues to have a headache.  Headache located in the left frontal region.  Headache associated with nausea and photophobia.    Patient is a 22 y.o. female presenting with headaches. The history is provided by the patient.  Headache  The problem occurs constantly. The headache is associated with nothing. The pain is located in the left unilateral region. The quality of the pain is described as throbbing. The pain does not radiate. Associated symptoms include nausea. Pertinent negatives include no fever, no near-syncope, no syncope and no vomiting. Associated symptoms comments: No visual field defects.  No neck pain or stiffness.  No fever or chills..    Past Medical History  Diagnosis Date  . No pertinent past medical history   . Migraine     Past Surgical History  Procedure Date  . No past surgeries     History reviewed. No pertinent family history.  History  Substance Use Topics  . Smoking status: Never Smoker   . Smokeless tobacco: Not on file  . Alcohol Use: Yes     Comment: occasion    OB History    Grav Para Term Preterm Abortions TAB SAB Ect Mult Living   0               Review of Systems  Constitutional: Negative for fever and chills.  HENT: Negative for neck pain and neck stiffness.   Eyes: Positive for photophobia.  Cardiovascular: Negative  for syncope and near-syncope.  Gastrointestinal: Positive for nausea. Negative for vomiting.  Neurological: Positive for headaches.  All other systems reviewed and are negative.    Allergies  Review of patient's allergies indicates no known allergies.  Home Medications   Current Outpatient Rx  Name  Route  Sig  Dispense  Refill  . NORGESTIM-ETH ESTRAD TRIPHASIC 0.18/0.215/0.25 MG-25 MCG PO TABS   Oral   Take 1 tablet by mouth daily.           BP 119/80  Pulse 78  Temp 97.7 F (36.5 C) (Oral)  Resp 18  SpO2 100%  Physical Exam  Nursing note and vitals reviewed. Constitutional: She appears well-developed and well-nourished. No distress.  HENT:  Head: Normocephalic and atraumatic.  Mouth/Throat: Oropharynx is clear and moist.  Eyes: EOM are normal. Pupils are equal, round, and reactive to light.  Neck: Normal range of motion. Neck supple.  Cardiovascular: Normal rate, regular rhythm and normal heart sounds.   Pulmonary/Chest: Effort normal and breath sounds normal.  Musculoskeletal: Normal range of motion.  Neurological: She is alert. She has normal strength. No cranial nerve deficit or sensory deficit. Gait normal.       Normal gait, no ataxia No visual field defect Normal Rapid Alternating Movements Normal finger to nose testing.  Skin: Skin is warm and dry. No rash noted. She is not diaphoretic.  Psychiatric: She has a  normal mood and affect.    ED Course  Procedures (including critical care time)  Labs Reviewed - No data to display No results found.   No diagnosis found.  5:39 PM Reassessed patient.  Patient reports that her headache has resolved at this time.  Will discharge the patient home.  MDM  Pt HA treated and improved while in ED.  Presentation is like pts typical HA and non concerning for [facility], ICH, or Meningitis. Pt is afebrile with no focal neuro deficits, nuchal rigidity, or change in vision. Pt is to follow up with PCP to discuss prophylactic  medication. Pt verbalizes understanding and is agreeable with plan to dc.         Pascal Lux Monroe, PA-C 05/24/12 1746

## 2012-10-15 ENCOUNTER — Ambulatory Visit (INDEPENDENT_AMBULATORY_CARE_PROVIDER_SITE_OTHER): Payer: 59 | Admitting: Emergency Medicine

## 2012-10-15 VITALS — BP 118/73 | HR 81 | Temp 98.0°F | Resp 16 | Ht 63.0 in | Wt 134.0 lb

## 2012-10-15 DIAGNOSIS — J018 Other acute sinusitis: Secondary | ICD-10-CM

## 2012-10-15 DIAGNOSIS — J209 Acute bronchitis, unspecified: Secondary | ICD-10-CM

## 2012-10-15 MED ORDER — HYDROCOD POLST-CHLORPHEN POLST 10-8 MG/5ML PO LQCR: 5.0000 mL | Freq: Two times a day (BID) | ORAL | Status: DC | PRN

## 2012-10-15 MED ORDER — PSEUDOEPHEDRINE-GUAIFENESIN ER 60-600 MG PO TB12: 1.0000 | ORAL_TABLET | Freq: Two times a day (BID) | ORAL | Status: AC

## 2012-10-15 MED ORDER — AMOXICILLIN-POT CLAVULANATE 875-125 MG PO TABS: 1.0000 | ORAL_TABLET | Freq: Two times a day (BID) | ORAL | Status: DC

## 2012-10-15 NOTE — Patient Instructions (Signed)

## 2012-10-15 NOTE — Progress Notes (Signed)
[facility] [address] [place] [address] [phone]  Date:  10/15/2012   Name:  Kristy Davis   DOB:  [birth year]   MRN:  [id number]  PCP:  Karie Chimera, MD    Chief Complaint: Sinusitis and Cough   History of Present Illness:  Kristy Davis is a 22 y.o. very pleasant female patient who presents with the following:  Ill since last Wednesday.  Has nasal congestion and post nasal drainage.  Has purulent drainage.  Sore throat, hoarseness, chills. No fever.  No nausea or vomiting.  No rash or stool change.  Has a new non productive cough.  No improvement with over the counter medications or other home remedies. Denies other complaint or health concern today.   Patient Active Problem List   Diagnosis Date Noted  . Migraine   . 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 No pertinent past medical history   . Migraine     Past Surgical History  Procedure Laterality Date  . No past surgeries      History  Substance Use Topics  . Smoking status: Never Smoker   . Smokeless tobacco: Not on file  . Alcohol Use: Yes     Comment: occasion    History reviewed. No pertinent family history.  No Known Allergies  Medication list has been reviewed and updated.  Current Outpatient Prescriptions on File Prior to Visit  Medication Sig Dispense Refill  . Norgestimate-Ethinyl Estradiol Triphasic (ORTHO TRI-CYCLEN LO) 0.18/0.215/0.25 MG-25 MCG tab Take 1 tablet by mouth daily.       No current facility-administered medications on file prior to visit.    Review of Systems:  As per HPI, otherwise negative.    Physical Examination: Filed Vitals:   10/15/12 1923  BP: 118/73  Pulse: 81  Temp: 98 F (36.7 C)  Resp: 16   Filed Vitals:   10/15/12 1923  Height: 5\' 3"  (1.6 m)  Weight: 134 lb (60.782 kg)   Body mass index is 23.74 kg/(m^2). Ideal Body Weight: Weight in (lb) to have BMI = 25: 140.8  GEN: WDWN, NAD,  Non-toxic, A & O x 3 HEENT: Atraumatic, Normocephalic. Neck supple. No masses, No LAD. Ears and Nose: No external deformity. CV: RRR, No M/G/R. No JVD. No thrill. No extra heart sounds. PULM: CTA B, no wheezes, crackles, rhonchi. No retractions. No resp. distress. No accessory muscle use. ABD: S, NT, ND, +BS. No rebound. No HSM. EXTR: No c/c/e NEURO Normal gait.  PSYCH: Normally interactive. Conversant. Not depressed or anxious appearing.  Calm demeanor.    Assessment and Plan: Sinusitis mucinex d augementin tussionex   Signed,  Phillips Odor, MD

## 2013-07-12 ENCOUNTER — Ambulatory Visit (INDEPENDENT_AMBULATORY_CARE_PROVIDER_SITE_OTHER): Payer: 59 | Admitting: Physician Assistant

## 2013-07-12 VITALS — BP 110/68 | HR 82 | Temp 98.4°F | Resp 16 | Ht 62.25 in | Wt 129.8 lb

## 2013-07-12 DIAGNOSIS — Z0000 Encounter for general adult medical examination without abnormal findings: Secondary | ICD-10-CM

## 2013-07-12 DIAGNOSIS — Z111 Encounter for screening for respiratory tuberculosis: Secondary | ICD-10-CM

## 2013-07-13 NOTE — Progress Notes (Signed)
   Subjective:    Patient ID: Kristy Davis, female    DOB: 01/19/1991, 23 y.o.   MRN: [id number]  HPI 23 year old female here for CPE and TB test.  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 Admits she enjoys it and does plan to continue this type of work.  She is healthy with no known medical problems. Only current daily medication is her OCP's which she is tolerating well.  Does have an annual gynecologic exam with her GYN and also has labwork done there. She does not wish to do any labwork today.      Review of Systems  Constitutional: Negative.   HENT: Negative.   Eyes: Negative.   Respiratory: Negative.   Cardiovascular: Negative.   Gastrointestinal: Negative.   Endocrine: Negative.   Genitourinary: Negative.   Musculoskeletal: Negative.   Skin: Negative.   Allergic/Immunologic: Positive for environmental allergies.  Neurological: Negative.   Hematological: Negative.   Psychiatric/Behavioral: Negative.        Objective:   Physical Exam  Constitutional: She is oriented to person, place, and time. She appears well-developed and well-nourished.  HENT:  Head: Normocephalic and atraumatic.  Right Ear: External ear normal.  Left Ear: External ear normal.  Mouth/Throat: Oropharynx is clear and moist.  Eyes: Conjunctivae and EOM are normal. Pupils are equal, round, and reactive to light.  Neck: Normal range of motion. Neck supple. No thyromegaly present.  Cardiovascular: Normal rate, regular rhythm and normal heart sounds.   Pulmonary/Chest: Effort normal and breath sounds normal.  Abdominal: Soft. Bowel sounds are normal. There is no tenderness. There is no rebound and no guarding.  Musculoskeletal: Normal range of motion.  Lymphadenopathy:    She has no cervical adenopathy.  Neurological: She is alert and oriented to person, place, and time. She has normal strength.  No cranial nerve deficit.  Reflex Scores:      Patellar reflexes are 2+ on the right side and 2+ on the left side. Psychiatric: She has a normal mood and affect. Her behavior is normal. Judgment and thought content normal.          Assessment & Plan:   Routine general medical examination at a health care facility  Screening for tuberculosis - Plan: TB Skin Test  Anticipatory guidance provided.   TB test placed.  Return in 48-72 hours for TB read Continue to f/u with GYN for annual wellness exams

## 2013-07-14 ENCOUNTER — Encounter (INDEPENDENT_AMBULATORY_CARE_PROVIDER_SITE_OTHER): Payer: 59 | Admitting: *Deleted

## 2013-07-14 DIAGNOSIS — Z111 Encounter for screening for respiratory tuberculosis: Secondary | ICD-10-CM

## 2013-07-14 LAB — TB SKIN TEST
Induration: 0 mm
TB Skin Test: NEGATIVE

## 2013-11-15 ENCOUNTER — Ambulatory Visit (INDEPENDENT_AMBULATORY_CARE_PROVIDER_SITE_OTHER): Payer: 59 | Admitting: Internal Medicine

## 2013-11-15 VITALS — BP 102/60 | HR 86 | Temp 98.3°F | Resp 16 | Ht 63.0 in | Wt 128.0 lb

## 2013-11-15 DIAGNOSIS — R05 Cough: Secondary | ICD-10-CM

## 2013-11-15 DIAGNOSIS — J018 Other acute sinusitis: Secondary | ICD-10-CM

## 2013-11-15 DIAGNOSIS — R059 Cough, unspecified: Secondary | ICD-10-CM

## 2013-11-15 MED ORDER — AMOXICILLIN 875 MG PO TABS: 875.0000 mg | ORAL_TABLET | Freq: Two times a day (BID) | ORAL | Status: DC

## 2013-11-15 MED ORDER — HYDROCODONE-HOMATROPINE 5-1.5 MG/5ML PO SYRP: 5.0000 mL | ORAL_SOLUTION | Freq: Four times a day (QID) | ORAL | Status: DC | PRN

## 2013-11-15 NOTE — Progress Notes (Signed)
Subjective:  This chart was scribed for Kristy Siaobert Doolittle, MD by Kristy Davis, Medical Scribe. This patient was seen in Room 13 and the patient's care was started at 9:18 AM.   Patient ID: Kristy Davis, female    DOB: 04/21/1990, 23 y.o.   MRN: [id number]  HPI HPI Comments: Kristy Davis is a 23 y.o. female who [facility] the [facility] complaining of constant sinusitis that started 4 days ago.  She endorses cough and ear pain as associated symptoms.  She denies fever as an associated symptom.  She has not taken any medication for her symptoms.  She suspects that she may have come in contact with sick children at work.  She is not allergic to Penicillin.    Past Medical History  Diagnosis Date   No pertinent past medical history    Migraine    Past Surgical History  Procedure Laterality Date   No past surgeries     No family history on file. History   Social History   Marital Status: Single    Spouse Name: N/A    Number of Children: N/A   Years of Education: N/A   Occupational History   Not on file.   Social History Main Topics   Smoking status: Never Smoker    Smokeless tobacco: Not on file   Alcohol Use: Yes     Comment: occasion   Drug Use: No   Sexual Activity: Yes    Birth Control/ Protection: Condom   Other Topics Concern   Not on file   Social History Narrative   No narrative on file   No Known Allergies  Review of Systems  Constitutional: Negative for fever.  HENT: Positive for ear pain.   Respiratory: Positive for cough.      Objective:  Physical Exam  Nursing note and vitals reviewed. Constitutional: She is oriented to person, place, and time. She appears well-developed and well-nourished. No distress.  HENT:  Head: Normocephalic and atraumatic.  Right Ear: Hearing, tympanic membrane, external ear and ear canal normal.  Left Ear: Hearing, tympanic membrane, external ear and ear canal normal.  Nose:  Rhinorrhea present.  Mouth/Throat: Uvula is midline, oropharynx is clear and moist and mucous membranes are normal.  Purulent mucous in nares.  Eyes: Conjunctivae and EOM are normal. Pupils are equal, round, and reactive to light.  Neck: Neck supple.  Cardiovascular: Normal rate, regular rhythm and normal heart sounds.  Exam reveals no gallop and no friction rub.   No murmur heard. Pulmonary/Chest: Effort normal and breath sounds normal. No respiratory distress. She has no wheezes. She has no rales.  Neurological: She is alert and oriented to person, place, and time. No cranial nerve deficit.  Psychiatric: She has a normal mood and affect. Her behavior is normal.    BP 102/60   Pulse 86   Temp(Src) 98.3 F (36.8 C) (Oral)   Resp 16   Ht 5\' 3"  (1.6 m)   Wt 128 lb (58.06 kg)   BMI 22.68 kg/m2   SpO2 100%   LMP 11/05/2013 Assessment & Plan:    I have completed the patient encounter in its entirety as documented by the scribe, with editing by me where necessary. Kristy Davis, M.D. Other acute sinusitis  Cough  Meds ordered this encounter  Medications   amoxicillin (AMOXIL) 875 MG tablet    Sig: Take 1 tablet (875 mg total) by mouth 2 (two) times daily.  Dispense:  20 tablet    Refill:  0   HYDROcodone-homatropine (HYCODAN) 5-1.5 MG/5ML syrup    Sig: Take 5 mLs by mouth every 6 (six) hours as needed.    Dispense:  120 mL    Refill:  0

## 2014-03-16 ENCOUNTER — Ambulatory Visit: Payer: 59

## 2014-05-23 ENCOUNTER — Ambulatory Visit (INDEPENDENT_AMBULATORY_CARE_PROVIDER_SITE_OTHER): Payer: 59 | Admitting: Physician Assistant

## 2014-05-23 VITALS — BP 118/62 | HR 79 | Temp 98.6°F | Resp 18 | Ht 64.75 in | Wt 129.8 lb

## 2014-05-23 DIAGNOSIS — J069 Acute upper respiratory infection, unspecified: Secondary | ICD-10-CM

## 2014-05-23 DIAGNOSIS — B9789 Other viral agents as the cause of diseases classified elsewhere: Principal | ICD-10-CM

## 2014-05-23 MED ORDER — GUAIFENESIN ER 1200 MG PO TB12: 1.0000 | ORAL_TABLET | Freq: Two times a day (BID) | ORAL | Status: DC | PRN

## 2014-05-23 MED ORDER — BENZONATATE 100 MG PO CAPS: 100.0000 mg | ORAL_CAPSULE | Freq: Three times a day (TID) | ORAL | Status: DC | PRN

## 2014-05-23 MED ORDER — IPRATROPIUM BROMIDE 0.03 % NA SOLN: 2.0000 | Freq: Two times a day (BID) | NASAL | Status: DC

## 2014-05-23 NOTE — Progress Notes (Signed)
Subjective:    Patient ID: Kristy Davis, female    DOB: [birth year], 24 y.o.   MRN: [id number]   PCP: Karie Chimera, MD  Chief Complaint  Patient presents with  . Cough    productive x 2 days     No Known Allergies  Patient Active Problem List   Diagnosis Date Noted  . Migraine   . 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 Authorizing Provider  MONONESSA 0.25-35 MG-MCG tablet  05/16/14   Historical Provider, MD    Medical, Surgical, Family and Social History reviewed and updated.  HPI  Presents, accompanied by her mother, with 2 days of cough productive of yellow sputum. She describes the same color when she blows her nose, which is congested. Ears feel "tight." No fever, chills. No nausea, vomiting, diarrhea. No muscle or joint pain. No rash. 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  Review of Systems As above.    Objective:   Physical Exam  Constitutional: She is oriented to person, place, and time. Vital signs are normal. She appears well-developed and well-nourished. She is active and cooperative. No distress.  Voice is hoarse.  HENT:  Head: Normocephalic and atraumatic.  Right Ear: Hearing, tympanic membrane, external ear and ear canal normal.  Left Ear: Hearing, tympanic membrane, external ear and ear canal normal.  Nose: Mucosal edema present.  No foreign bodies. Right sinus exhibits no maxillary sinus tenderness and no frontal sinus tenderness. Left sinus exhibits no maxillary sinus tenderness and no frontal sinus tenderness.  Mouth/Throat: Uvula is midline, oropharynx is clear and moist and mucous membranes are normal. No uvula swelling. No oropharyngeal exudate.  Eyes: Conjunctivae and EOM are normal. Pupils are equal, round, and reactive to light. Right eye exhibits no discharge. Left eye exhibits no discharge. No scleral icterus.  Neck:  Trachea normal, normal range of motion and full passive range of motion without pain. Neck supple. No thyroid mass and no thyromegaly present.  Cardiovascular: Normal rate, regular rhythm and normal heart sounds.   Pulmonary/Chest: Effort normal and breath sounds normal.  Lymphadenopathy:       Head (right side): No submandibular, no tonsillar, no preauricular, no posterior auricular and no occipital adenopathy present.       Head (left side): No submandibular, no tonsillar, no preauricular and no occipital adenopathy present.    She has no cervical adenopathy.       Right: No supraclavicular adenopathy present.       Left: No supraclavicular adenopathy present.  Neurological: She is alert and oriented to person, place, and time. She has normal strength. No cranial nerve deficit or sensory deficit.  Skin: Skin is warm, dry and intact. No rash noted.  Psychiatric: She has a normal mood and affect. Her speech is normal and behavior is normal.          Assessment & Plan:  1. Viral URI with cough Supportive care.  Anticipatory guidance.  RTC if symptoms worsen/persist. - benzonatate (TESSALON) 100 MG capsule; Take 1-2 capsules (100-200 mg total) by mouth 3 (three) times daily as needed for cough.  Dispense: 40 capsule; Refill: 0 - ipratropium (ATROVENT) 0.03 % nasal spray; Place 2 sprays into both nostrils 2 (two) times daily.  Dispense: 30 mL; Refill: 0 - Guaifenesin (MUCINEX MAXIMUM STRENGTH) 1200 MG TB12; Take 1 tablet (1,200 mg total) by mouth every 12 (  twelve) hours as needed.  Dispense: 14 tablet; Refill: 1   Fernande Brashelle S. Jahmeir Geisen, PA-C Physician Assistant-Certified [facility]

## 2014-05-23 NOTE — Patient Instructions (Signed)
Get plenty of rest and drink at least 64 ounces of water daily. 

## 2014-07-17 ENCOUNTER — Ambulatory Visit (INDEPENDENT_AMBULATORY_CARE_PROVIDER_SITE_OTHER): Payer: 59 | Admitting: Physician Assistant

## 2014-07-17 VITALS — BP 110/62 | HR 77 | Temp 98.1°F | Resp 16 | Ht 64.0 in | Wt 123.0 lb

## 2014-07-17 DIAGNOSIS — Z111 Encounter for screening for respiratory tuberculosis: Secondary | ICD-10-CM | POA: Diagnosis not present

## 2014-07-17 NOTE — Progress Notes (Signed)

## 2014-07-19 ENCOUNTER — Ambulatory Visit (INDEPENDENT_AMBULATORY_CARE_PROVIDER_SITE_OTHER): Payer: 59 | Admitting: Family Medicine

## 2014-07-19 DIAGNOSIS — Z7689 Persons encountering health services in other specified circumstances: Secondary | ICD-10-CM

## 2014-07-19 DIAGNOSIS — Z111 Encounter for screening for respiratory tuberculosis: Secondary | ICD-10-CM

## 2014-07-19 LAB — TB SKIN TEST
Induration: 0 mm
TB SKIN TEST: NEGATIVE

## 2014-07-19 NOTE — Progress Notes (Signed)
   Subjective:    Patient ID: Kristy Davis, female    DOB: 12/25/1990, 24 y.o.   MRN: [id number]  HPI here today for PPD read. PPD placed 07/17/14 at 1:39 pm. She is within 48-72 hour window.    Review of Systems     Objective:   Physical Exam        Assessment & Plan:  PPD negative 0 mm induration Copy of results given

## 2015-04-20 ENCOUNTER — Ambulatory Visit (INDEPENDENT_AMBULATORY_CARE_PROVIDER_SITE_OTHER): Payer: Managed Care, Other (non HMO) | Admitting: Physician Assistant

## 2015-04-20 VITALS — BP 118/64 | HR 87 | Temp 98.3°F | Resp 16 | Ht 64.0 in | Wt 123.6 lb

## 2015-04-20 DIAGNOSIS — J069 Acute upper respiratory infection, unspecified: Secondary | ICD-10-CM

## 2015-04-20 DIAGNOSIS — J309 Allergic rhinitis, unspecified: Secondary | ICD-10-CM | POA: Diagnosis not present

## 2015-04-20 MED ORDER — IPRATROPIUM BROMIDE 0.03 % NA SOLN
2.0000 | Freq: Two times a day (BID) | NASAL | Status: DC
Start: 2015-04-20 — End: 2017-06-11

## 2015-04-20 MED ORDER — GUAIFENESIN ER 1200 MG PO TB12: 1.0000 | ORAL_TABLET | Freq: Two times a day (BID) | ORAL | Status: DC | PRN

## 2015-04-20 NOTE — Patient Instructions (Signed)
Drink plenty of water (64 oz/day) and get plenty of rest. Take mucinex twice a day. If you have been prescribed a nasal spray, use twice a day. Take an antihistamine like zyrtec since this sounds like allergies as well. If your symptoms are not improving in 1 week, return to clinic.  In the future with these type of illness, take mucinex twice a day, nasal spray twice a day and zyrtec-D and see if it helps.

## 2015-04-20 NOTE — Progress Notes (Signed)
[facility] [address], [place] [address] [phone]  Date:  04/20/2015   Name:  BUENA BOEHM   DOB:  [birth year]   MRN:  [id number]  PCP:  Karie Chimera, MD    Chief Complaint: Sinus Problem and Cough   History of Present Illness:  This is a 25 y.o. female with PMH migraine who is presenting with sinus congestion that began 10 days ago. She took sudafed and went away. 3 days ago woke with sinus congestion again. Face hurting and ears hurting as well. Dry cough. Started taking sudafed again. Face pain and pressure went away. Still having ear fullness and states her ears itch. Still having cough as well. Denies fever, chills, sob, wheezing. Having itchy eyes and sneezing as well. Otherwise states she is feeling ok.  No hx asthma. Does have a history of env allergies - usually bothered in summertime.  States she has problems with her allergies a few times a year - likes to come in and get prescriptions because they work better than what she takes at home.  Review of Systems:  Review of Systems See HPI  Patient Active Problem List   Diagnosis Date Noted  . Migraine   . 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 Authorizing Provider  MONONESSA 0.25-35 MG-MCG tablet  05/16/14  Yes Historical Provider, MD    No Known Allergies  Past Surgical History  Procedure Laterality Date  . No past surgeries      Social History  Substance Use Topics  . Smoking status: Never Smoker   . Smokeless tobacco: Never Used  . Alcohol Use: 0.0 oz/week    0 Standard drinks or equivalent per week     Comment: occasion    History reviewed. No pertinent family history.  Medication list has been reviewed and updated.  Physical Examination:  Physical Exam  Constitutional: She is oriented to person, place, and time. She appears well-developed and well-nourished. No distress.  HENT:  Head: Normocephalic  and atraumatic.  Right Ear: Hearing, external ear and ear canal normal. Tympanic membrane is retracted.  Left Ear: Hearing, external ear and ear canal normal. Tympanic membrane is retracted.  Nose: Right sinus exhibits no maxillary sinus tenderness and no frontal sinus tenderness. Left sinus exhibits no maxillary sinus tenderness and no frontal sinus tenderness.  Mouth/Throat: Uvula is midline and mucous membranes are normal. Posterior oropharyngeal erythema present. No oropharyngeal exudate or posterior oropharyngeal edema.  Pale mucosa right nostril, pink mucosa left nostril  Eyes: Conjunctivae and lids are normal. Right eye exhibits no discharge. Left eye exhibits no discharge. No scleral icterus.  Cardiovascular: Normal rate, regular rhythm, normal heart sounds and normal pulses.   No murmur heard. Pulmonary/Chest: Effort normal and breath sounds normal. No respiratory distress. She has no wheezes. She has no rhonchi. She has no rales.  Musculoskeletal: Normal range of motion.  Lymphadenopathy:       Head (right side): No submental, no submandibular and no tonsillar adenopathy present.       Head (left side): No submental, no submandibular and no tonsillar adenopathy present.    She has no cervical adenopathy.  Neurological: She is alert and oriented to person, place, and time.  Skin: Skin is warm, dry and intact. No lesion and no rash noted.  Psychiatric: She has a normal mood and affect. Her speech is normal and behavior is normal. Thought content  normal.   BP 118/64 mmHg  Pulse 87  Temp(Src) 98.3 F (36.8 C) (Oral)  Resp 16  Ht 5\' 4"  (1.626 m)  Wt 123 lb 9.6 oz (56.065 kg)  BMI 21.21 kg/m2  SpO2 98%  LMP 04/10/2015  Assessment and Plan:  1. Viral URI 2. Allergic rhinitis Likely dual contribution of viral uri and allergic rhinitis. She will take mucinex BID and atrovent BID. Advised she switch from sudafed to either zyrtec or zyrtec-D. Discussed measures to take in the future.  Return in 1 week if symptoms not improving. - ipratropium (ATROVENT) 0.03 % nasal spray; Place 2 sprays into both nostrils 2 (two) times daily.  Dispense: 30 mL; Refill: 0 - Guaifenesin (MUCINEX MAXIMUM STRENGTH) 1200 MG TB12; Take 1 tablet (1,200 mg total) by mouth every 12 (twelve) hours as needed.  Dispense: 14 tablet; Refill: 1    Charday Capetillo V. Dyke BrackettBush, PA-C, MHS [facility]  04/20/2015

## 2015-11-21 ENCOUNTER — Emergency Department (HOSPITAL_COMMUNITY)
Admission: EM | Admit: 2015-11-21 | Discharge: 2015-11-21 | Disposition: A | Discharge status: Home / Self Care | Payer: Self-pay | Attending: Emergency Medicine | Admitting: Emergency Medicine

## 2015-11-21 ENCOUNTER — Encounter (HOSPITAL_COMMUNITY): Payer: Self-pay | Admitting: *Deleted

## 2015-11-21 ENCOUNTER — Emergency Department (HOSPITAL_COMMUNITY): Payer: Self-pay

## 2015-11-21 DIAGNOSIS — N83209 Unspecified ovarian cyst, unspecified side: Secondary | ICD-10-CM

## 2015-11-21 DIAGNOSIS — R102 Pelvic and perineal pain: Secondary | ICD-10-CM | POA: Insufficient documentation

## 2015-11-21 DIAGNOSIS — N83201 Unspecified ovarian cyst, right side: Secondary | ICD-10-CM | POA: Insufficient documentation

## 2015-11-21 LAB — URINALYSIS, ROUTINE W REFLEX MICROSCOPIC
GLUCOSE, UA: NEGATIVE mg/dL
Ketones, ur: >80 mg/dL — AB
Leukocytes, UA: NEGATIVE
NITRITE: NEGATIVE
PH: 6 (ref 5.0–8.0)
Protein, ur: NEGATIVE mg/dL
SPECIFIC GRAVITY, URINE: 1.023 (ref 1.005–1.030)

## 2015-11-21 LAB — URINE MICROSCOPIC-ADD ON

## 2015-11-21 LAB — COMPREHENSIVE METABOLIC PANEL
ALBUMIN: 4.1 g/dL (ref 3.5–5.0)
ALT: 26 U/L (ref 14–54)
ANION GAP: 10 (ref 5–15)
AST: 25 U/L (ref 15–41)
Alkaline Phosphatase: 51 U/L (ref 38–126)
BUN: 8 mg/dL (ref 6–20)
CHLORIDE: 99 mmol/L — AB (ref 101–111)
CO2: 20 mmol/L — AB (ref 22–32)
Calcium: 8.8 mg/dL — ABNORMAL LOW (ref 8.9–10.3)
Creatinine, Ser: 0.9 mg/dL (ref 0.44–1.00)
GFR calc Af Amer: >60 mL/min (ref >60)
GFR calc non Af Amer: >60 mL/min (ref >60)
GLUCOSE: 82 mg/dL (ref 65–99)
POTASSIUM: 3.6 mmol/L (ref 3.5–5.1)
SODIUM: 129 mmol/L — AB (ref 135–145)
Total Bilirubin: 1.9 mg/dL — ABNORMAL HIGH (ref 0.3–1.2)
Total Protein: 7.4 g/dL (ref 6.5–8.1)

## 2015-11-21 LAB — CBC
HEMATOCRIT: 40.4 % (ref 36.0–46.0)
HEMOGLOBIN: 13.2 g/dL (ref 12.0–15.0)
MCH: 29.9 pg (ref 26.0–34.0)
MCHC: 32.7 g/dL (ref 30.0–36.0)
MCV: 91.4 fL (ref 78.0–100.0)
Platelets: 192 10*3/uL (ref 150–400)
RBC: 4.42 MIL/uL (ref 3.87–5.11)
RDW: 12.6 % (ref 11.5–15.5)
WBC: 11.8 10*3/uL — ABNORMAL HIGH (ref 4.0–10.5)

## 2015-11-21 LAB — WET PREP, GENITAL
Clue Cells Wet Prep HPF POC: NONE SEEN
SPERM: NONE SEEN
Trich, Wet Prep: NONE SEEN
YEAST WET PREP: NONE SEEN

## 2015-11-21 LAB — I-STAT BETA HCG BLOOD, ED (MC, WL, AP ONLY): I-stat hCG, quantitative: <5 m[IU]/mL (ref <5)

## 2015-11-21 LAB — LIPASE, BLOOD: Lipase: 26 U/L (ref 11–51)

## 2015-11-21 MED ORDER — IBUPROFEN 400 MG PO TABS: 400.0000 mg | ORAL_TABLET | Freq: Four times a day (QID) | ORAL | 0 refills | Status: AC | PRN

## 2015-11-21 MED ORDER — MORPHINE SULFATE (PF) 2 MG/ML IV SOLN
2.0000 mg | Freq: Once | INTRAVENOUS | Status: DC
  Filled 2015-11-21: qty 1

## 2015-11-21 MED ORDER — HYDROCODONE-ACETAMINOPHEN 5-325 MG PO TABS: 1.0000 | ORAL_TABLET | Freq: Four times a day (QID) | ORAL | 0 refills | Status: AC | PRN

## 2015-11-21 MED ORDER — HYDROCODONE-ACETAMINOPHEN 5-325 MG PO TABS
1.0000 | ORAL_TABLET | Freq: Once | ORAL | Status: AC
  Administered 2015-11-21: 1 via ORAL
  Filled 2015-11-21: qty 1

## 2015-11-21 NOTE — Discharge Instructions (Signed)
Please primarily use ibuprofen (advil, motrin) for your pain, as directed on the prescription I have provided.  If you need more control over your pain than this provides, you can use the Norco (hydrocodone) prescription.  Please use it as little as possible.  Please schedule followup with the women's clinic (information above), for further management of your cyst.

## 2015-11-21 NOTE — ED Triage Notes (Signed)
Pt reports RLQ pain x 2 days with urinary frequency. Denies vaginal symptoms.

## 2015-11-21 NOTE — ED Provider Notes (Signed)
[facility]-EMERGENCY DEPT Provider Note   CSN: [id number] Arrival date & time: 11/21/15  4098  First Provider Contact:  None       History   Chief Complaint Chief Complaint  Patient presents with  . Abdominal Pain    HPI Kristy Davis is a 25 y.o. female.  HPI  This is a patient with a history of left ovarian cyst, and right hydrosalpinx, both remote, but today presents with right lower quadrant pain. She describes pain is waking her from sleep about 2 days ago. It's "sharp and stabbing" in the right lower area. It radiates to the left side. Has intermittent exacerbations with movement. Cholesterol intake was yesterday, and she denies any nausea, vomiting, or irritability. She also denies fevers, chest pain. She denies any vaginal bleeding or discharge. Denies constipation or diarrhea. She has one sexual partner, does have a history of STDs before, but does not think she has an STD at present. She states this feels similar to the ovarian cyst she's had before, but worse this time.  Past Medical History:  Diagnosis Date  . Allergy   . Migraine     Patient Active Problem List   Diagnosis Date Noted  . Migraine   . Hydrosalpinx 11/23/2010    Past Surgical History:  Procedure Laterality Date  . NO PAST SURGERIES      OB History    Gravida Para Term Preterm AB Living   0             SAB TAB Ectopic Multiple Live Births                   Home Medications    Prior to Admission medications   Medication Sig Start Date End Date Taking? Authorizing Provider  Guaifenesin (MUCINEX MAXIMUM STRENGTH) 1200 MG TB12 Take 1 tablet (1,200 mg total) by mouth every 12 (twelve) hours as needed. 04/20/15   Dorna Leitz, PA-C  HYDROcodone-acetaminophen (NORCO/VICODIN) 5-325 MG tablet Take 1 tablet by mouth every 6 (six) hours as needed for severe pain. 11/21/15 11/24/15  Marcelina Morel, MD  ibuprofen (ADVIL,MOTRIN) 400 MG tablet Take 1 tablet (400 mg total) by mouth every 6 (six) hours as  needed for moderate pain. 11/21/15 11/26/15  Marcelina Morel, MD  ipratropium (ATROVENT) 0.03 % nasal spray Place 2 sprays into both nostrils 2 (two) times daily. 04/20/15   Dorna Leitz, PA-C  MONONESSA 0.25-35 MG-MCG tablet  05/16/14   Historical Provider, MD    Family History History reviewed. No pertinent family history.  Social History Social History  Substance Use Topics  . Smoking status: Never Smoker  . Smokeless tobacco: Never Used  . Alcohol use 0.0 oz/week     Comment: occasion     Allergies   Review of patient's allergies indicates no known allergies.   Review of Systems Review of Systems  Constitutional: Negative for chills and fever.  HENT: Negative for ear pain and sore throat.   Eyes: Negative for pain and visual disturbance.  Respiratory: Negative for cough and shortness of breath.   Cardiovascular: Negative for chest pain and palpitations.  Gastrointestinal: Negative for abdominal pain and vomiting.  Genitourinary: Positive for pelvic pain. Negative for dysuria and hematuria.  Musculoskeletal: Negative for arthralgias and back pain.  Skin: Negative for color change and rash.  Neurological: Negative for seizures and syncope.  All other systems reviewed and are negative.    Physical Exam Updated Vital Signs BP 114/63   Pulse 83  Temp 98.3 F (36.8 C) (Oral)   Resp 18   LMP 11/06/2015   SpO2 99%   Physical Exam  Constitutional: She appears well-developed and well-nourished. No distress.  HENT:  Head: Normocephalic and atraumatic.  Eyes: Conjunctivae are normal.  Neck: Neck supple.  Cardiovascular: Normal rate and regular rhythm.   No murmur heard. Pulmonary/Chest: Effort normal and breath sounds normal. No respiratory distress.  Abdominal: Soft. There is no tenderness.  Genitourinary: Cervix exhibits discharge (white). Cervix exhibits no motion tenderness. Right adnexum displays tenderness. Right adnexum displays no mass and no fullness. Left  adnexum displays no mass, no tenderness and no fullness.  Musculoskeletal: She exhibits no edema.  Neurological: She is alert.  Skin: Skin is warm and dry.  Psychiatric: She has a normal mood and affect.  Nursing note and vitals reviewed.    ED Treatments / Results  Labs (all labs ordered are listed, but only abnormal results are displayed) Labs Reviewed  WET PREP, GENITAL - Abnormal; Notable for the following:       Result Value   WBC, Wet Prep HPF POC PRESENT (*)    All other components within normal limits  COMPREHENSIVE METABOLIC PANEL - Abnormal; Notable for the following:    Sodium 129 (*)    Chloride 99 (*)    CO2 20 (*)    Calcium 8.8 (*)    Total Bilirubin 1.9 (*)    All other components within normal limits  CBC - Abnormal; Notable for the following:    WBC 11.8 (*)    All other components within normal limits  URINALYSIS, ROUTINE W REFLEX MICROSCOPIC (NOT AT [facility]) - Abnormal; Notable for the following:    Color, Urine AMBER (*)    APPearance CLOUDY (*)    Hgb urine dipstick MODERATE (*)    Bilirubin Urine SMALL (*)    Ketones, ur >80 (*)    All other components within normal limits  URINE MICROSCOPIC-ADD ON - Abnormal; Notable for the following:    Squamous Epithelial / LPF 6-30 (*)    Bacteria, UA FEW (*)    All other components within normal limits  LIPASE, BLOOD  I-STAT BETA HCG BLOOD, ED ([facility], [facility], [facility] ONLY)  GC/CHLAMYDIA PROBE AMP ([place]) NOT AT [facility]    EKG  EKG Interpretation None       Radiology US Pelvis Complete  Result Date: 11/21/2015 CLINICAL DATA:  Right adnexal tenderness for 2 days. EXAM: TRANSABDOMINAL ULTRASOUND OF PELVIS TECHNIQUE: Transabdominal ultrasound examination of the pelvis was performed including evaluation of the uterus, ovaries, adnexal regions, and pelvic cul-de-sac. COMPARISON:  None. FINDINGS: Uterus Measurements: 6.8 x 3.3 x 3.8 cm. No fibroids or other mass visualized. Endometrium Thickness: Normal at 6 mm. No mass  or fluid appreciated within the endometrial canal. Right ovary Measurements: 5 x 4.1 x 4.4 cm. Complex hypoechoic mass within the right ovary measures 3.5 x 3 cm, avascular, most likely hemorrhagic cyst. Blood flow is shown within the surrounding ovarian parenchyma. No mass or free fluid identified in the adjacent right adnexal region. Left ovary Measurements: 2.9 x 2.3 x 2.3 cm. Normal appearance/no adnexal mass. Other findings:  No abnormal free fluid. IMPRESSION: 1. Probable hemorrhagic cyst within the right ovary measuring 3.5 x 3 cm. Consider follow-up pelvic ultrasound in 10-12 weeks to ensure resolution. No mass or free fluid seen within the adjacent right adnexal region. 2. Uterus and left ovary appear normal. Electronically Signed   By: Anne Ng.D.  On: 11/21/2015 12:39    Procedures Procedures (including critical care time)  Medications Ordered in ED Medications  HYDROcodone-acetaminophen (NORCO/VICODIN) 5-325 MG per tablet 1 tablet (1 tablet Oral Given 11/21/15 1140)     Initial Impression / Assessment and Plan / ED Course  I have reviewed the triage vital signs and the nursing notes.  Pertinent labs & imaging results that were available during my care of the patient were reviewed by me and considered in my medical decision making (see chart for details).  Clinical Course    Patient with right adnexal tenderness.  No CMT, cervical erythema or friability on exam.  Overall low risk for STD, as she is active with 1 partner and uses protection.  Wet prep performed and was negative for BV / Candida / or Trich.  Given history of GYN cysts before, and adnexal tenderness, pelvic ultrasound was ordered and showed right sided hemorrhagic cyst.  Given that her tenderness is adnexal, I believe this is reasonably the source of her pain.    She does not have particular RLQ pain, or anorexia, or fevers that would suggest appendicitis.  However, I have advised her that any worsening symptoms  (as above), should prompt her to return for further evaluation.  Doubt TOA in setting of normal cervical exam / negative ultrasound.  I have reviewed this patient's information in the [place]  Controlled Substance Reporting System for controlled substance prescriptions in the past 12 months and found them to have the following controlled substance prescriptions: none.  Opiates were prescribed for an acute, painful condition.  The patient was given information on side effects, and encouraged to use other, non-opiate pain medication primary, and only use opiate medicine sparingly.  I would like her to f/u with  GYN for reassessment of her cyst, or return here for worsening symptoms.  We have discussed the discharge plan, including the plan for outpatient followup, and strict return precautions, including those that would require calling 911.     Final Clinical Impressions(s) / ED Diagnoses   Final diagnoses:  Adnexal tenderness, right  Hemorrhagic cyst of ovary    New Prescriptions New Prescriptions   HYDROCODONE-ACETAMINOPHEN (NORCO/VICODIN) 5-325 MG TABLET    Take 1 tablet by mouth every 6 (six) hours as needed for severe pain.   IBUPROFEN (ADVIL,MOTRIN) 400 MG TABLET    Take 1 tablet (400 mg total) by mouth every 6 (six) hours as needed for moderate pain.     Marcelina MorelMichael Kesha Hurrell, MD 11/21/15 1313    Arby BarretteMarcy Pfeiffer, MD 11/21/15 1350

## 2015-11-22 LAB — GC/CHLAMYDIA PROBE AMP (~~LOC~~) NOT AT ARMC
CHLAMYDIA, DNA PROBE: NEGATIVE
Neisseria Gonorrhea: NEGATIVE

## 2017-04-17 NOTE — L&D Delivery Note (Signed)
Patient: DARIEN MIGNOGNA MRN: [id number]  GBS status: negative, IAP given: no  Patient is a 27 y.o. now G1P1 s/p NSVD at [redacted]w[redacted]d, who was admitted for SROM and early labor. SROM 6h 73m prior to delivery with moderate meconium stained fluid.    Delivery Note At 7:43 PM a viable female was delivered via Vaginal, Spontaneous (Presentation: LOA; cephalic).  APGAR: 9, 9; weight  .   Placenta status: intact, 3vessel cord.  Cord:  with the following complications: none.    Anesthesia: epidural  Episiotomy: None Lacerations: 2nd degree;Perineal Suture Repair: 3.0 vicryl Est. Blood Loss (mL): 100  Mom to postpartum.  Baby to Couplet care / Skin to Skin.  Gwenevere Abbot 02/06/2018, 8:54 PM     Head delivered LOA. No nuchal cord present. Shoulder and body delivered in usual fashion. Infant with spontaneous cry, placed on mother's abdomen, dried and bulb suctioned. Cord clamped x 2 after 1-minute delay, and cut by family member. Cord blood drawn. Placenta delivered spontaneously with gentle cord traction. Fundus firm with massage and Pitocin. Perineum inspected and found to have 2nd degree perineal laceration, which was repaired with 3.0 vicryl with good hemostasis achieved.

## 2017-06-11 ENCOUNTER — Ambulatory Visit (INDEPENDENT_AMBULATORY_CARE_PROVIDER_SITE_OTHER): Payer: BLUE CROSS/BLUE SHIELD | Admitting: *Deleted

## 2017-06-11 ENCOUNTER — Encounter: Payer: Self-pay | Admitting: Family Medicine

## 2017-06-11 DIAGNOSIS — Z3201 Encounter for pregnancy test, result positive: Secondary | ICD-10-CM

## 2017-06-11 DIAGNOSIS — Z3200 Encounter for pregnancy test, result unknown: Secondary | ICD-10-CM

## 2017-06-11 NOTE — Progress Notes (Addendum)
Pt informed of +UPT today. LMP - 05/02/17   EDD - 02/06/18  Medication reconciliation completed. Pt advised to begin prenatal vitamins.  Pt will schedule prenatal care.   I have reviewed the chart and agree with nursing staff's documentation of this patient's encounter.  Elsie LincolnKelly Leggett, MD 06/12/2017 2:01 PM

## 2017-06-12 LAB — POCT PREGNANCY, URINE: Preg Test, Ur: POSITIVE — AB

## 2017-07-16 DIAGNOSIS — Z3490 Encounter for supervision of normal pregnancy, unspecified, unspecified trimester: Secondary | ICD-10-CM | POA: Insufficient documentation

## 2017-07-17 ENCOUNTER — Encounter: Payer: BLUE CROSS/BLUE SHIELD | Admitting: Certified Nurse Midwife

## 2017-07-17 ENCOUNTER — Encounter: Payer: Self-pay | Admitting: Obstetrics

## 2017-07-17 ENCOUNTER — Encounter: Payer: BLUE CROSS/BLUE SHIELD | Admitting: Obstetrics & Gynecology

## 2017-07-17 ENCOUNTER — Ambulatory Visit (INDEPENDENT_AMBULATORY_CARE_PROVIDER_SITE_OTHER): Payer: BLUE CROSS/BLUE SHIELD | Admitting: Obstetrics

## 2017-07-17 ENCOUNTER — Other Ambulatory Visit (HOSPITAL_COMMUNITY)
Admission: RE | Admit: 2017-07-17 | Discharge: 2017-07-17 | Disposition: A | Discharge status: Home / Self Care | Payer: Medicaid Other | Source: Ambulatory Visit | Attending: Obstetrics | Admitting: Obstetrics

## 2017-07-17 VITALS — BP 124/75 | HR 94 | Wt 151.8 lb

## 2017-07-17 DIAGNOSIS — Z3401 Encounter for supervision of normal first pregnancy, first trimester: Secondary | ICD-10-CM | POA: Diagnosis not present

## 2017-07-17 DIAGNOSIS — Z3490 Encounter for supervision of normal pregnancy, unspecified, unspecified trimester: Secondary | ICD-10-CM | POA: Insufficient documentation

## 2017-07-17 DIAGNOSIS — J301 Allergic rhinitis due to pollen: Secondary | ICD-10-CM

## 2017-07-17 MED ORDER — LORATADINE 10 MG PO TABS: 10.0000 mg | ORAL_TABLET | Freq: Every day | ORAL | 11 refills | Status: DC

## 2017-07-17 NOTE — Progress Notes (Signed)
Subjective:  Kristy Davis is a 27 y.o. G1P0 at 7636w6d being seen today for ongoing prenatal care.  She is currently monitored for the following issues for this low-risk pregnancy and has Hydrosalpinx; Migraine; and Supervision of normal pregnancy, antepartum on their problem list.  Patient reports no complaints.  Contractions: Not present. Vag. Bleeding: None.   . Denies leaking of fluid.   The following portions of the patient's history were reviewed and updated as appropriate: allergies, current medications, past family history, past medical history, past social history, past surgical history and problem list. Problem list updated.  Objective:   Vitals:   07/17/17 1113  BP: 124/75  Pulse: 94  Weight: 151 lb 12.8 oz (68.9 kg)    Fetal Status: Fetal Heart Rate (bpm): 160         General:  Alert, oriented and cooperative. Patient is in no acute distress.  Skin: Skin is warm and dry. No rash noted.   Cardiovascular: Normal heart rate noted  Respiratory: Normal respiratory effort, no problems with respiration noted  Abdomen: Soft, gravid, appropriate for gestational age. Pain/Pressure: Absent     Pelvic:  Cervical exam deferred        Extremities: Normal range of motion.  Edema: None  Mental Status: Normal mood and affect. Normal behavior. Normal judgment and thought content.   Urinalysis:      Assessment and Plan:  Pregnancy: G1P0 at 7036w6d  1. Encounter for supervision of normal pregnancy, antepartum, unspecified gravidity Rx: - Enroll Patient in Babyscripts - Obstetric Panel, Including HIV - Hemoglobinopathy evaluation - Culture, OB Urine - Cervicovaginal ancillary only - Cytology - PAP - Cystic Fibrosis Mutation 97 - Genetic Screening  2. Seasonal allergic rhinitis due to pollen Rx: - loratadine (CLARITIN) 10 MG tablet; Take 1 tablet (10 mg total) by mouth daily.  Dispense: 30 tablet; Refill: 11  Preterm labor symptoms and general obstetric precautions including but  not limited to vaginal bleeding, contractions, leaking of fluid and fetal movement were reviewed in detail with the patient. Please refer to After Visit Summary for other counseling recommendations.  Return in about 1 month (around 08/14/2017) for ROB.   Brock BadHarper, Hendry Speas A, MD

## 2017-07-17 NOTE — Progress Notes (Signed)
Patient is in the office for NOB appt, unplanned pregnancy, FOB is involved and they are living together. Pt states she had an u/s and [facility], denies pain today.

## 2017-07-18 ENCOUNTER — Other Ambulatory Visit: Payer: Self-pay

## 2017-07-18 DIAGNOSIS — Z3490 Encounter for supervision of normal pregnancy, unspecified, unspecified trimester: Secondary | ICD-10-CM

## 2017-07-18 LAB — CYTOLOGY - PAP: Diagnosis: NEGATIVE

## 2017-07-18 LAB — CERVICOVAGINAL ANCILLARY ONLY
BACTERIAL VAGINITIS: NEGATIVE
CANDIDA VAGINITIS: NEGATIVE
CHLAMYDIA, DNA PROBE: NEGATIVE
NEISSERIA GONORRHEA: NEGATIVE
TRICH (WINDOWPATH): NEGATIVE

## 2017-07-18 MED ORDER — PRENATAL PLUS 27-1 MG PO TABS: 1.0000 | ORAL_TABLET | Freq: Every day | ORAL | 4 refills | Status: DC

## 2017-07-18 MED ORDER — PRENATAL MULTIVITAMIN CH: 1.0000 | ORAL_TABLET | Freq: Every day | ORAL | 12 refills | Status: DC

## 2017-07-18 NOTE — Addendum Note (Signed)
Addended by: Coral CeoHARPER, Laquia Rosano A on: 07/18/2017 11:39 AM   Modules accepted: Orders

## 2017-07-19 LAB — CULTURE, OB URINE

## 2017-07-19 LAB — URINE CULTURE, OB REFLEX

## 2017-07-23 ENCOUNTER — Encounter: Payer: Self-pay | Admitting: Obstetrics

## 2017-07-24 LAB — OBSTETRIC PANEL, INCLUDING HIV
Antibody Screen: NEGATIVE
BASOS: 0 %
Basophils Absolute: 0 10*3/uL (ref 0.0–0.2)
EOS (ABSOLUTE): 0 10*3/uL (ref 0.0–0.4)
EOS: 1 %
HEMATOCRIT: 33.3 % — AB (ref 34.0–46.6)
HEMOGLOBIN: 11.1 g/dL (ref 11.1–15.9)
HIV Screen 4th Generation wRfx: NONREACTIVE
Hepatitis B Surface Ag: NEGATIVE
IMMATURE GRANS (ABS): 0 10*3/uL (ref 0.0–0.1)
IMMATURE GRANULOCYTES: 0 %
LYMPHS ABS: 2.2 10*3/uL (ref 0.7–3.1)
Lymphs: 33 %
MCH: 29.3 pg (ref 26.6–33.0)
MCHC: 33.3 g/dL (ref 31.5–35.7)
MCV: 88 fL (ref 79–97)
MONOS ABS: 0.2 10*3/uL (ref 0.1–0.9)
Monocytes: 3 %
NEUTROS PCT: 63 %
Neutrophils Absolute: 4.2 10*3/uL (ref 1.4–7.0)
Platelets: 235 10*3/uL (ref 150–379)
RBC: 3.79 x10E6/uL (ref 3.77–5.28)
RDW: 13.8 % (ref 12.3–15.4)
RH TYPE: POSITIVE
RPR Ser Ql: NONREACTIVE
Rubella Antibodies, IGG: 0.91 index — ABNORMAL LOW (ref >0.99)
WBC: 6.7 10*3/uL (ref 3.4–10.8)

## 2017-07-24 LAB — HEMOGLOBINOPATHY EVALUATION
HGB C: 0 %
HGB S: 0 %
HGB VARIANT: 0 %
Hemoglobin A2 Quantitation: 2.6 % (ref 1.8–3.2)
Hemoglobin F Quantitation: 0 % (ref 0.0–2.0)
Hgb A: 97.4 % (ref 96.4–98.8)

## 2017-07-24 LAB — CYSTIC FIBROSIS MUTATION 97: GENE DIS ANAL CARRIER INTERP BLD/T-IMP: NOT DETECTED

## 2017-07-27 ENCOUNTER — Encounter: Payer: Self-pay | Admitting: Family Medicine

## 2017-07-27 DIAGNOSIS — Z3490 Encounter for supervision of normal pregnancy, unspecified, unspecified trimester: Secondary | ICD-10-CM

## 2017-07-27 NOTE — Progress Notes (Signed)
Chart reviewed.

## 2017-08-14 ENCOUNTER — Telehealth: Payer: Self-pay

## 2017-08-14 ENCOUNTER — Encounter: Payer: Self-pay | Admitting: Obstetrics

## 2017-08-14 ENCOUNTER — Ambulatory Visit (INDEPENDENT_AMBULATORY_CARE_PROVIDER_SITE_OTHER): Payer: Medicaid Other | Admitting: Obstetrics

## 2017-08-14 VITALS — BP 123/74 | HR 92 | Temp 99.1°F | Wt 156.4 lb

## 2017-08-14 DIAGNOSIS — Z3490 Encounter for supervision of normal pregnancy, unspecified, unspecified trimester: Secondary | ICD-10-CM

## 2017-08-14 DIAGNOSIS — Z3402 Encounter for supervision of normal first pregnancy, second trimester: Secondary | ICD-10-CM

## 2017-08-14 NOTE — Telephone Encounter (Signed)
TC from pt stating after leaving appt today she notices brown spotting. Pt denies bright red bleeding, no cramps, no pain. Pt advised to watch however got to MAU  if any bleeding continues for assurance.

## 2017-08-14 NOTE — Progress Notes (Signed)
Subjective:  Kristy Davis is a 27 y.o. G1P0 at [redacted]w[redacted]d being seen today for ongoing prenatal care.  She is currently monitored for the following issues for this low-risk pregnancy and has Hydrosalpinx; Migraine; and Supervision of normal pregnancy, antepartum on their problem list.  Patient reports heartburn.  Contractions: Not present. Vag. Bleeding: None.   . Denies leaking of fluid.   The following portions of the patient's history were reviewed and updated as appropriate: allergies, current medications, past family history, past medical history, past social history, past surgical history and problem list. Problem list updated.  Objective:   Vitals:   08/14/17 0851  BP: 123/74  Pulse: 92  Temp: 99.1 F (37.3 C)  Weight: 156 lb 6.4 oz (70.9 kg)    Fetal Status:           General:  Alert, oriented and cooperative. Patient is in no acute distress.  Skin: Skin is warm and dry. No rash noted.   Cardiovascular: Normal heart rate noted  Respiratory: Normal respiratory effort, no problems with respiration noted  Abdomen: Soft, gravid, appropriate for gestational age. Pain/Pressure: Absent     Pelvic:  Cervical exam deferred        Extremities: Normal range of motion.  Edema: None  Mental Status: Normal mood and affect. Normal behavior. Normal judgment and thought content.   Urinalysis:      Assessment and Plan:  Pregnancy: G1P0 at [redacted]w[redacted]d  1. Encounter for supervision of normal pregnancy, antepartum, unspecified gravidity   Preterm labor symptoms and general obstetric precautions including but not limited to vaginal bleeding, contractions, leaking of fluid and fetal movement were reviewed in detail with the patient. Please refer to After Visit Summary for other counseling recommendations.  Return in about 2 weeks (around 08/28/2017) for ROB.  AFP.   Brock Bad, MD

## 2017-08-15 ENCOUNTER — Telehealth: Payer: Self-pay

## 2017-08-15 ENCOUNTER — Other Ambulatory Visit: Payer: Self-pay

## 2017-08-15 DIAGNOSIS — Z3490 Encounter for supervision of normal pregnancy, unspecified, unspecified trimester: Secondary | ICD-10-CM

## 2017-08-15 MED ORDER — PRENATAL MULTIVITAMIN CH: 1.0000 | ORAL_TABLET | Freq: Every day | ORAL | 12 refills | Status: DC

## 2017-08-15 MED ORDER — PRENATAL PLUS 27-1 MG PO TABS: 1.0000 | ORAL_TABLET | Freq: Every day | ORAL | 4 refills | Status: DC

## 2017-08-15 NOTE — Progress Notes (Signed)
rx was printed had to resend

## 2017-08-15 NOTE — Telephone Encounter (Signed)
Pt states she did not receive prenatal Rx.

## 2017-08-21 ENCOUNTER — Encounter (HOSPITAL_COMMUNITY): Payer: Self-pay | Admitting: *Deleted

## 2017-08-21 ENCOUNTER — Inpatient Hospital Stay (HOSPITAL_COMMUNITY)
Admission: AD | Admit: 2017-08-21 | Discharge: 2017-08-21 | Disposition: A | Discharge status: Home / Self Care | Payer: Medicaid Other | Source: Ambulatory Visit | Attending: Family Medicine | Admitting: Family Medicine

## 2017-08-21 DIAGNOSIS — O26892 Other specified pregnancy related conditions, second trimester: Secondary | ICD-10-CM | POA: Diagnosis not present

## 2017-08-21 DIAGNOSIS — N949 Unspecified condition associated with female genital organs and menstrual cycle: Secondary | ICD-10-CM

## 2017-08-21 DIAGNOSIS — Z79899 Other long term (current) drug therapy: Secondary | ICD-10-CM | POA: Diagnosis not present

## 2017-08-21 DIAGNOSIS — Z3A15 15 weeks gestation of pregnancy: Secondary | ICD-10-CM | POA: Diagnosis not present

## 2017-08-21 DIAGNOSIS — R102 Pelvic and perineal pain: Secondary | ICD-10-CM | POA: Insufficient documentation

## 2017-08-21 LAB — URINALYSIS, ROUTINE W REFLEX MICROSCOPIC
Bilirubin Urine: NEGATIVE
Glucose, UA: NEGATIVE mg/dL
Hgb urine dipstick: NEGATIVE
Ketones, ur: NEGATIVE mg/dL
LEUKOCYTES UA: NEGATIVE
NITRITE: NEGATIVE
PH: 6 (ref 5.0–8.0)
Protein, ur: NEGATIVE mg/dL
Specific Gravity, Urine: 1.018 (ref 1.005–1.030)

## 2017-08-21 NOTE — MAU Note (Signed)
Pt presents to MAU with complaints of lower abdominal pain that started this morning. Denies any VB

## 2017-08-21 NOTE — MAU Provider Note (Signed)
History     CSN: [id number]  Arrival date and time: 08/21/17 4098   First Provider Initiated Contact with Patient 08/21/17 1918      Chief Complaint  Patient presents with  . Pelvic Pain   Pelvic Pain  The patient's primary symptoms include pelvic pain. The patient's pertinent negatives include no vaginal discharge. This is a new problem. The current episode started today. The problem occurs constantly. The problem has been unchanged. Pain severity now: 9/10. The problem affects the left side. She is pregnant. Pertinent negatives include no chills, dysuria, fever, frequency, nausea or vomiting. The vaginal discharge was normal. There has been no bleeding. The symptoms are aggravated by activity (certain movements). She has tried nothing for the symptoms. Sexual activity: No intercourse in the last 24 hours.      Past Medical History:  Diagnosis Date  . Allergy   . Migraine     Past Surgical History:  Procedure Laterality Date  . NO PAST SURGERIES      History reviewed. No pertinent family history.  Social History   Tobacco Use  . Smoking status: Never Smoker  . Smokeless tobacco: Never Used  Substance Use Topics  . Alcohol use: Yes    Alcohol/week: 0.0 oz    Comment: occasion  . Drug use: No    Allergies: No Known Allergies  Medications Prior to Admission  Medication Sig Dispense Refill Last Dose  . loratadine (CLARITIN) 10 MG tablet Take 1 tablet (10 mg total) by mouth daily. 30 tablet 11 Taking  . Prenatal Vit-Fe Fumarate-FA (PRENATAL MULTIVITAMIN) TABS tablet Take 1 tablet by mouth daily at 12 noon. 30 tablet 12   . prenatal vitamin w/FE, FA (PRENATAL 1 + 1) 27-1 MG TABS tablet Take 1 tablet by mouth daily before breakfast. 90 each 4   . valACYclovir (VALTREX) 500 MG tablet Take 500 mg by mouth as needed.   Taking    Review of Systems  Constitutional: Negative for chills and fever.  Gastrointestinal: Negative for nausea and vomiting.  Genitourinary: Positive  for pelvic pain. Negative for dysuria, frequency, vaginal bleeding and vaginal discharge.   Physical Exam   Blood pressure 113/61, pulse 90, temperature 98.1 F (36.7 C), resp. rate 16, height  (1.6 m), weight 157 lb (71.2 kg), last menstrual period 05/02/2017.  Physical Exam  Nursing note and vitals reviewed. Constitutional: She is oriented to person, place, and time. She appears well-developed and well-nourished. No distress.  HENT:  Head: Normocephalic.  Cardiovascular: Normal rate.  Respiratory: Effort normal.  GI: Soft. There is no tenderness. There is no rebound.  Genitourinary:  Genitourinary Comments:  External: no lesion Vagina: small amount of white discharge Cervix: pink, smooth, closed/thick  Uterus: AGA FHT 148 with doppler   Neurological: She is alert and oriented to person, place, and time.  Skin: Skin is warm and dry.  Psychiatric: She has a normal mood and affect.     Results for orders placed or performed during the hospital encounter of 08/21/17 (from the past 24 hour(s))  Urinalysis, Routine w reflex microscopic     Status: None   Collection Time: 08/21/17  6:39 PM  Result Value Ref Range   Color, Urine YELLOW YELLOW   APPearance CLEAR CLEAR   Specific Gravity, Urine 1.018 1.005 - 1.030   pH 6.0 5.0 - 8.0   Glucose, UA NEGATIVE NEGATIVE mg/dL   Hgb urine dipstick NEGATIVE NEGATIVE   Bilirubin Urine NEGATIVE NEGATIVE   Ketones, ur NEGATIVE  NEGATIVE mg/dL   Protein, ur NEGATIVE NEGATIVE mg/dL   Nitrite NEGATIVE NEGATIVE   Leukocytes, UA NEGATIVE NEGATIVE   MAU Course  Procedures  MDM   Assessment and Plan   1. Round ligament pain   2. [redacted] weeks gestation of pregnancy    DC home Comfort measures reviewed  2nd Trimester precautions  PTL precautions  Fetal kick counts RX: none  Return to MAU as needed FU with OB as planned  Follow-up Information    [facility] Follow up.   Specialty:  Obstetrics and  Gynecology Contact information: [address] [place] [address] [phone]           Thressa Sheller 08/21/2017, 7:20 PM

## 2017-08-21 NOTE — Discharge Instructions (Signed)

## 2017-08-23 ENCOUNTER — Telehealth: Payer: Self-pay

## 2017-08-23 NOTE — Telephone Encounter (Signed)
Returned call, pt stated that she has been suffering from a lot of gas and constipation. Advised of safe otc meds, pt agreed.

## 2017-08-24 ENCOUNTER — Encounter (HOSPITAL_COMMUNITY): Payer: Self-pay | Admitting: *Deleted

## 2017-08-24 ENCOUNTER — Inpatient Hospital Stay (HOSPITAL_COMMUNITY)
Admission: AD | Admit: 2017-08-24 | Discharge: 2017-08-24 | Disposition: A | Discharge status: Home / Self Care | Payer: Medicaid Other | Source: Ambulatory Visit | Attending: Obstetrics & Gynecology | Admitting: Obstetrics & Gynecology

## 2017-08-24 ENCOUNTER — Inpatient Hospital Stay (HOSPITAL_COMMUNITY): Payer: Medicaid Other

## 2017-08-24 DIAGNOSIS — O3482 Maternal care for other abnormalities of pelvic organs, second trimester: Secondary | ICD-10-CM | POA: Insufficient documentation

## 2017-08-24 DIAGNOSIS — O219 Vomiting of pregnancy, unspecified: Secondary | ICD-10-CM | POA: Insufficient documentation

## 2017-08-24 DIAGNOSIS — E860 Dehydration: Secondary | ICD-10-CM

## 2017-08-24 DIAGNOSIS — R109 Unspecified abdominal pain: Secondary | ICD-10-CM | POA: Diagnosis not present

## 2017-08-24 DIAGNOSIS — N83201 Unspecified ovarian cyst, right side: Secondary | ICD-10-CM | POA: Diagnosis not present

## 2017-08-24 DIAGNOSIS — R112 Nausea with vomiting, unspecified: Secondary | ICD-10-CM

## 2017-08-24 DIAGNOSIS — R1031 Right lower quadrant pain: Secondary | ICD-10-CM | POA: Diagnosis not present

## 2017-08-24 DIAGNOSIS — O321XX0 Maternal care for breech presentation, not applicable or unspecified: Secondary | ICD-10-CM | POA: Diagnosis not present

## 2017-08-24 DIAGNOSIS — O99282 Endocrine, nutritional and metabolic diseases complicating pregnancy, second trimester: Secondary | ICD-10-CM | POA: Insufficient documentation

## 2017-08-24 DIAGNOSIS — O26892 Other specified pregnancy related conditions, second trimester: Secondary | ICD-10-CM | POA: Insufficient documentation

## 2017-08-24 DIAGNOSIS — O26899 Other specified pregnancy related conditions, unspecified trimester: Secondary | ICD-10-CM

## 2017-08-24 DIAGNOSIS — Z3A16 16 weeks gestation of pregnancy: Secondary | ICD-10-CM | POA: Insufficient documentation

## 2017-08-24 LAB — COMPREHENSIVE METABOLIC PANEL
ALT: 10 U/L — AB (ref 14–54)
ANION GAP: 10 (ref 5–15)
AST: 18 U/L (ref 15–41)
Albumin: 3.3 g/dL — ABNORMAL LOW (ref 3.5–5.0)
Alkaline Phosphatase: 54 U/L (ref 38–126)
BUN: 7 mg/dL (ref 6–20)
CALCIUM: 8.9 mg/dL (ref 8.9–10.3)
CHLORIDE: 104 mmol/L (ref 101–111)
CO2: 22 mmol/L (ref 22–32)
CREATININE: 0.59 mg/dL (ref 0.44–1.00)
GFR calc Af Amer: >60 mL/min (ref >60)
Glucose, Bld: 83 mg/dL (ref 65–99)
Potassium: 4.4 mmol/L (ref 3.5–5.1)
Sodium: 136 mmol/L (ref 135–145)
Total Bilirubin: 0.7 mg/dL (ref 0.3–1.2)
Total Protein: 6.7 g/dL (ref 6.5–8.1)

## 2017-08-24 LAB — URINALYSIS, ROUTINE W REFLEX MICROSCOPIC
BILIRUBIN URINE: NEGATIVE
Bacteria, UA: NONE SEEN
Glucose, UA: NEGATIVE mg/dL
Ketones, ur: 80 mg/dL — AB
LEUKOCYTES UA: NEGATIVE
NITRITE: NEGATIVE
Protein, ur: 30 mg/dL — AB
SPECIFIC GRAVITY, URINE: 1.024 (ref 1.005–1.030)
pH: 6 (ref 5.0–8.0)

## 2017-08-24 LAB — CBC WITH DIFFERENTIAL/PLATELET
Basophils Absolute: 0 10*3/uL (ref 0.0–0.1)
Basophils Relative: 0 %
EOS ABS: 0 10*3/uL (ref 0.0–0.7)
EOS PCT: 0 %
HCT: 32.5 % — ABNORMAL LOW (ref 36.0–46.0)
Hemoglobin: 10.8 g/dL — ABNORMAL LOW (ref 12.0–15.0)
LYMPHS PCT: 15 %
Lymphs Abs: 1.8 10*3/uL (ref 0.7–4.0)
MCH: 30.2 pg (ref 26.0–34.0)
MCHC: 33.2 g/dL (ref 30.0–36.0)
MCV: 90.8 fL (ref 78.0–100.0)
Monocytes Absolute: 0.3 10*3/uL (ref 0.1–1.0)
Monocytes Relative: 3 %
Neutro Abs: 10.1 10*3/uL — ABNORMAL HIGH (ref 1.7–7.7)
Neutrophils Relative %: 82 %
PLATELETS: 189 10*3/uL (ref 150–400)
RBC: 3.58 MIL/uL — AB (ref 3.87–5.11)
RDW: 13.2 % (ref 11.5–15.5)
WBC: 12.2 10*3/uL — AB (ref 4.0–10.5)

## 2017-08-24 MED ORDER — TRAMADOL HCL 50 MG PO TABS: 50.0000 mg | ORAL_TABLET | Freq: Four times a day (QID) | ORAL | 0 refills | Status: DC | PRN

## 2017-08-24 MED ORDER — IBUPROFEN 600 MG PO TABS: 600.0000 mg | ORAL_TABLET | Freq: Four times a day (QID) | ORAL | 0 refills | Status: DC | PRN

## 2017-08-24 MED ORDER — KETOROLAC TROMETHAMINE 60 MG/2ML IM SOLN: 60.0000 mg | Freq: Once | INTRAMUSCULAR | Status: DC

## 2017-08-24 MED ORDER — DEXTROSE 5 % IN LACTATED RINGERS IV BOLUS
1000.0000 mL | Freq: Once | INTRAVENOUS | Status: AC
  Administered 2017-08-24: 1000 mL via INTRAVENOUS

## 2017-08-24 MED ORDER — IOPAMIDOL (ISOVUE-300) INJECTION 61%
30.0000 mL | INTRAVENOUS | Status: AC
  Administered 2017-08-24: 30 mL via ORAL

## 2017-08-24 MED ORDER — LACTATED RINGERS IV BOLUS
1000.0000 mL | Freq: Once | INTRAVENOUS | Status: AC
  Administered 2017-08-24: 1000 mL via INTRAVENOUS

## 2017-08-24 MED ORDER — IOPAMIDOL (ISOVUE-300) INJECTION 61%
100.0000 mL | Freq: Once | INTRAVENOUS | Status: AC | PRN
  Administered 2017-08-24: 100 mL via INTRAVENOUS

## 2017-08-24 MED ORDER — KETOROLAC TROMETHAMINE 30 MG/ML IJ SOLN
30.0000 mg | Freq: Once | INTRAMUSCULAR | Status: AC
  Administered 2017-08-24: 30 mg via INTRAVENOUS
  Filled 2017-08-24: qty 1

## 2017-08-24 NOTE — MAU Provider Note (Signed)
History     CSN: [id number]  Arrival date and time: 08/24/17 5859   None     Chief Complaint  Patient presents with  . Abdominal Pain   HPI   Kristy Davis is a 27 y.o. female G1P0 @ 62w2dhere in MAU with lower abdominal pain. History of right hemorrhage cyst in 2017. Says she was seen on 5/7 with this pain and was told that it was round ligament pain. The pain started on that day that she was initially seen on 5/7. The pain at that time was in her LLQ, the pain is now in her RLQ and much worse. The pain is constant and says that walking makes the pain worse. No bleeding. Says she has not taken anything for the pain. The pain is sharp and hurts really bad. Says the pain is 10/10.  No intercourse since she found out she was pregnant. She had one episode of N/V last night, none since. Has not been able to eat much due to the pain.   OB History    Gravida  1   Para      Term      Preterm      AB      Living        SAB      TAB      Ectopic      Multiple      Live Births              Past Medical History:  Diagnosis Date  . Allergy   . Migraine     Past Surgical History:  Procedure Laterality Date  . WISDOM TOOTH EXTRACTION      History reviewed. No pertinent family history.  Social History   Tobacco Use  . Smoking status: Never Smoker  . Smokeless tobacco: Never Used  Substance Use Topics  . Alcohol use: Yes    Alcohol/week: 0.0 oz    Comment: occasion  . Drug use: No    Allergies: No Known Allergies  Medications Prior to Admission  Medication Sig Dispense Refill Last Dose  . loratadine (CLARITIN) 10 MG tablet Take 1 tablet (10 mg total) by mouth daily. 30 tablet 11 Taking  . Prenatal Vit-Fe Fumarate-FA (PRENATAL MULTIVITAMIN) TABS tablet Take 1 tablet by mouth daily at 12 noon. 30 tablet 12   . prenatal vitamin w/FE, FA (PRENATAL 1 + 1) 27-1 MG TABS tablet Take 1 tablet by mouth daily before breakfast. 90 each 4   . valACYclovir  (VALTREX) 500 MG tablet Take 500 mg by mouth as needed.   Taking   Results for orders placed or performed during the hospital encounter of 08/24/17 (from the past 48 hour(s))  Urinalysis, Routine w reflex microscopic     Status: Abnormal   Collection Time: 08/24/17  7:13 AM  Result Value Ref Range   Color, Urine AMBER (A) YELLOW    Comment: BIOCHEMICALS MAY BE AFFECTED BY COLOR   APPearance HAZY (A) CLEAR   Specific Gravity, Urine 1.024 1.005 - 1.030   pH 6.0 5.0 - 8.0   Glucose, UA NEGATIVE NEGATIVE mg/dL   Hgb urine dipstick SMALL (A) NEGATIVE   Bilirubin Urine NEGATIVE NEGATIVE   Ketones, ur 80 (A) NEGATIVE mg/dL   Protein, ur 30 (A) NEGATIVE mg/dL   Nitrite NEGATIVE NEGATIVE   Leukocytes, UA NEGATIVE NEGATIVE   RBC / HPF 11-20 0 - 5 RBC/hpf   WBC, UA 0-5 0 - 5 WBC/hpf  Bacteria, UA NONE SEEN NONE SEEN   Squamous Epithelial / LPF 6-10 0 - 5   Mucus PRESENT     Comment: Performed at [facility], [address] [place], [place] [address]  CBC with Differential     Status: Abnormal   Collection Time: 08/24/17  9:20 AM  Result Value Ref Range   WBC 12.2 (H) 4.0 - 10.5 K/uL   RBC 3.58 (L) 3.87 - 5.11 MIL/uL   Hemoglobin 10.8 (L) 12.0 - 15.0 g/dL   HCT 32.5 (L) 36.0 - 46.0 %   MCV 90.8 78.0 - 100.0 fL   MCH 30.2 26.0 - 34.0 pg   MCHC 33.2 30.0 - 36.0 g/dL   RDW 13.2 11.5 - 15.5 %   Platelets 189 150 - 400 K/uL   Neutrophils Relative % 82 %   Neutro Abs 10.1 (H) 1.7 - 7.7 K/uL   Lymphocytes Relative 15 %   Lymphs Abs 1.8 0.7 - 4.0 K/uL   Monocytes Relative 3 %   Monocytes Absolute 0.3 0.1 - 1.0 K/uL   Eosinophils Relative 0 %   Eosinophils Absolute 0.0 0.0 - 0.7 K/uL   Basophils Relative 0 %   Basophils Absolute 0.0 0.0 - 0.1 K/uL    Comment: Performed at [facility], [address], [place], [place] [address]  Comprehensive metabolic panel     Status: Abnormal   Collection Time: 08/24/17  9:20 AM  Result Value Ref Range   Sodium 136 135 - 145 mmol/L    Potassium 4.4 3.5 - 5.1 mmol/L   Chloride 104 101 - 111 mmol/L   CO2 22 22 - 32 mmol/L   Glucose, Bld 83 65 - 99 mg/dL   BUN 7 6 - 20 mg/dL   Creatinine, Ser 0.59 0.44 - 1.00 mg/dL   Calcium 8.9 8.9 - 10.3 mg/dL   Total Protein 6.7 6.5 - 8.1 g/dL   Albumin 3.3 (L) 3.5 - 5.0 g/dL   AST 18 15 - 41 U/L   ALT 10 (L) 14 - 54 U/L   Alkaline Phosphatase 54 38 - 126 U/L   Total Bilirubin 0.7 0.3 - 1.2 mg/dL   GFR calc non Af Amer >60 >60 mL/min   GFR calc Af Amer >60 >60 mL/min    Comment: (NOTE) The eGFR has been calculated using the CKD EPI equation. This calculation has not been validated in all clinical situations. eGFR's persistently <60 mL/min signify possible Chronic Kidney Disease.    Anion gap 10 5 - 15    Comment: Performed at [facility], [address] [place], [place] [address]    Ct Abdomen Pelvis W Contrast  Result Date: 08/24/2017 CLINICAL DATA:  Worsening right lower quadrant pain for 1 week. Leukocytosis. [redacted] weeks pregnant. EXAM: CT ABDOMEN AND PELVIS WITH CONTRAST TECHNIQUE: Multidetector CT imaging of the abdomen and pelvis was performed using the standard protocol following bolus administration of intravenous contrast. CONTRAST:  151m ISOVUE-300 IOPAMIDOL (ISOVUE-300) INJECTION 61% COMPARISON:  None. FINDINGS: Lower Chest: No acute findings. Hepatobiliary: No hepatic masses identified. Gallbladder is unremarkable. Pancreas:  No mass or inflammatory changes. Spleen: Within normal limits in size and appearance. Adrenals/Urinary Tract: No masses identified. No evidence of hydronephrosis. Stomach/Bowel: No evidence of dilated bowel loops or bowel wall thickening. Normal appendix is visualized. Vascular/Lymphatic: No pathologically enlarged lymph nodes. No abdominal aortic aneurysm. Reproductive: Gravid uterus is seen with single intrauterine fetus in breech presentation. A small collapsing right ovarian corpus luteum is noted. No adnexal masses  are identified. Mild soft  tissue stranding and small amount of free fluid is seen in the right adnexa and cul-de-sac. No abscess identified. Other:  None. Musculoskeletal:  No suspicious bone lesions identified. IMPRESSION: No evidence of appendicitis. Small collapsing right ovarian corpus luteum, with associated soft tissue stranding and small amount of free fluid. This may be due to ruptured corpus luteum, or possibly pelvic inflammatory disease. No abscess identified. Single intrauterine fetus in breech presentation. Electronically Signed   By: Earle Gell M.D.   On: 08/24/2017 13:43   Review of Systems  Constitutional: Negative for fever.  Gastrointestinal: Positive for abdominal pain, nausea and vomiting (Vomited 1X yesterday ).  Genitourinary: Negative for vaginal bleeding, vaginal discharge and vaginal pain.   Physical Exam   Blood pressure 115/68, pulse 98, temperature 98.7 F (37.1 C), temperature source Oral, resp. rate 16, height 5' 3"  (1.6 m), weight 155 lb (70.3 kg), last menstrual period 05/02/2017, SpO2 100 %.  Physical Exam  Constitutional: She is oriented to person, place, and time. She appears well-developed and well-nourished.  Non-toxic appearance. She has a sickly appearance. No distress.  HENT:  Head: Normocephalic.  Eyes: Pupils are equal, round, and reactive to light.  Respiratory: Effort normal and breath sounds normal.  GI: Soft. Normal appearance. There is tenderness in the right lower quadrant and suprapubic area. There is rebound and guarding. There is no rigidity.  Genitourinary:  Genitourinary Comments: Cervix closed, posterior, Long  Musculoskeletal: Normal range of motion.  Neurological: She is alert and oriented to person, place, and time.  Skin: Skin is warm. She is not diaphoretic.  Psychiatric: Her behavior is normal.   MAU Course  Procedures  None  MDM  + fetal heart tones  UA shows >80 ketones, D5LR bolus given & LR bolus given  + leukocytosis with rebound and N/V: CT  of abdomen ordered, and Limited US for cervical length.  Toradol given 30 mg IV Patient says here pain is down from 10/10 to 0/10 following fluid US shows cervical length >4 cm.  Discussed CT scan results in detail with Dr. Harolyn Rutherford. Negative for acute appendicitis. Patient continues to be pain free.   Assessment and Plan   A:  1. Right ovarian cyst   2. Abdominal pain in pregnancy   3. [redacted] weeks gestation of pregnancy   4. Nausea and vomiting, intractability of vomiting not specified, unspecified vomiting type   5. Dehydration, severe     P:  Discharge home in stable condition Rx: Ibuprofen X 3 days only, Ultram for severe pain Return to MAU if symptoms worsen Follow up with OB as scheduled or sooner if needed Discussed [place] results in detail with the patient.  Small, frequent meals with increase in oral fluid intake.    Noni Saupe I, NP 08/25/2017 8:12 AM

## 2017-08-24 NOTE — Discharge Instructions (Signed)
You have constipation which is hard stools that are difficult to pass. It is important to have regular bowel movements every 1-3 days that are soft and easy to pass. Hard stools increase your risk of hemorrhoids and are very uncomfortable.   To prevent constipation you can increase the amount of fiber in your diet. Examples of foods with fiber are leafy greens, whole grain breads, oatmeal and other grains.  It is also important to drink at least eight 8oz glass of water everyday.   If you have not has a bowel movement in 4-5 days you made need to clean out your bowel.  This will have establish normal movement through your bowel.    Miralax Clean out  Take 8 capfuls of miralax in 64 oz of gatorade. You can use any fluid that appeals to you (gatorade, water, juice)  Continue to drink at least eight 8 oz glasses of water throughout the day  You can repeat with another 8 capfuls of miralax in 64 oz of gatorade if you are not having a large amount of stools  You will need to be at home and close to a bathroom for about 8 hours when you do the above as you may need to go to the bathroom frequently.   After you are cleaned out: - Start Colace100mg  twice daily - Start Miralax once daily - Start a daily fiber supplement like metamucil or citrucel - You can safely use enemas in pregnancy  - if you are having diarrhea you can reduce to Colace once a day or miralax every other day or a 1/2 capful daily.      Ovarian Cyst An ovarian cyst is a fluid-filled sac on an ovary. The ovaries are organs that make eggs in women. Most ovarian cysts go away on their own and are not cancerous (are benign). Some cysts need treatment. Follow these instructions at home:  Take over-the-counter and prescription medicines only as told by your doctor.  Do not drive or use heavy machinery while taking prescription pain medicine.  Get pelvic exams and Pap tests as often as told by your doctor.  Return to your  normal activities as told by your doctor. Ask your doctor what activities are safe for you.  Do not use any products that contain nicotine or tobacco, such as cigarettes and e-cigarettes. If you need help quitting, ask your doctor.  Keep all follow-up visits as told by your doctor. This is important. Contact a doctor if:  Your periods are: ? Late. ? Irregular. ? Painful.  Your periods stop.  You have pelvic pain that does not go away.  You have pressure on your bladder.  You have trouble making your bladder empty when you pee (urinate).  You have pain during sex.  You have any of the following in your belly (abdomen): ? A feeling of fullness. ? Pressure. ? Discomfort. ? Pain that does not go away. ? Swelling.  You feel sick most of the time.  You have trouble pooping (have constipation).  You are not as hungry as usual (you lose your appetite).  You get very bad acne.  You start to have more hair on your body and face.  You are gaining weight or losing weight without changing your exercise and eating habits.  You think you may be pregnant. Get help right away if:  You have belly pain that is very bad or gets worse.  You cannot eat or drink without throwing up (vomiting).  You suddenly get a fever.  Your period is a lot heavier than usual. This information is not intended to replace advice given to you by your health care provider. Make sure you discuss any questions you have with your health care provider. Document Released: 09/20/2007 Document Revised: 10/22/2015 Document Reviewed: 09/05/2015 Elsevier Interactive Patient Education  [employer].

## 2017-08-24 NOTE — MAU Note (Signed)
Pt presents with complaint of pain right lower abd pain off/on for a few days, started earlier this week on left side and pt was evaluated here and was told it was RL pain. Denies bleeding but had a brown discharge last pm.

## 2017-08-26 ENCOUNTER — Encounter (HOSPITAL_COMMUNITY): Payer: Self-pay

## 2017-08-26 ENCOUNTER — Inpatient Hospital Stay (HOSPITAL_COMMUNITY)
Admission: AD | Admit: 2017-08-26 | Discharge: 2017-08-26 | Disposition: A | Discharge status: Home / Self Care | Payer: Medicaid Other | Source: Ambulatory Visit | Attending: Obstetrics & Gynecology | Admitting: Obstetrics & Gynecology

## 2017-08-26 DIAGNOSIS — K5900 Constipation, unspecified: Secondary | ICD-10-CM | POA: Diagnosis not present

## 2017-08-26 DIAGNOSIS — R141 Gas pain: Secondary | ICD-10-CM | POA: Diagnosis not present

## 2017-08-26 DIAGNOSIS — O26892 Other specified pregnancy related conditions, second trimester: Secondary | ICD-10-CM | POA: Insufficient documentation

## 2017-08-26 DIAGNOSIS — O99612 Diseases of the digestive system complicating pregnancy, second trimester: Secondary | ICD-10-CM | POA: Diagnosis present

## 2017-08-26 DIAGNOSIS — Z3A16 16 weeks gestation of pregnancy: Secondary | ICD-10-CM | POA: Diagnosis not present

## 2017-08-26 DIAGNOSIS — R079 Chest pain, unspecified: Secondary | ICD-10-CM | POA: Diagnosis present

## 2017-08-26 HISTORY — DX: Unspecified ovarian cyst, unspecified side: N83.209

## 2017-08-26 LAB — URINALYSIS, ROUTINE W REFLEX MICROSCOPIC
Bilirubin Urine: NEGATIVE
Glucose, UA: NEGATIVE mg/dL
Hgb urine dipstick: NEGATIVE
Ketones, ur: NEGATIVE mg/dL
LEUKOCYTES UA: NEGATIVE
NITRITE: NEGATIVE
PH: 7 (ref 5.0–8.0)
Protein, ur: NEGATIVE mg/dL
Specific Gravity, Urine: 1.011 (ref 1.005–1.030)

## 2017-08-26 MED ORDER — CYCLOBENZAPRINE HCL 5 MG PO TABS
5.0000 mg | ORAL_TABLET | Freq: Once | ORAL | Status: AC
Start: 2017-08-26 — End: 2017-08-26
  Administered 2017-08-26: 5 mg via ORAL
  Filled 2017-08-26: qty 1

## 2017-08-26 MED ORDER — POLYETHYLENE GLYCOL 3350 17 G PO PACK: 17.0000 g | PACK | Freq: Every day | ORAL | 0 refills | Status: DC

## 2017-08-26 MED ORDER — DOCUSATE SODIUM 100 MG PO CAPS: 100.0000 mg | ORAL_CAPSULE | Freq: Two times a day (BID) | ORAL | 0 refills | Status: DC

## 2017-08-26 MED ORDER — GI COCKTAIL ~~LOC~~
30.0000 mL | Freq: Once | ORAL | Status: AC
  Administered 2017-08-26: 30 mL via ORAL
  Filled 2017-08-26: qty 30

## 2017-08-26 NOTE — Discharge Instructions (Signed)

## 2017-08-26 NOTE — MAU Note (Signed)
Patient c/o  +left sided chest pain States pain radiates to left shoulder and back down arm. Reports numbness in fingers.  Describes as a tightness Rating pain 8/10 Has not taken anything for the pain  +shortness of breath (breathing not labored in triage; O2 sats 100%) Endorses its worse lying down  Denies vaginal bleeding or discharge.

## 2017-08-26 NOTE — MAU Provider Note (Signed)
History     CSN: [id number]  Arrival date and time: 08/26/17 7829   First Provider Initiated Contact with Patient 08/26/17 [phone]      Chief Complaint  Patient presents with  . Chest Pain  . Shortness of Breath   HPI  Ms.  Kristy Davis is a 27 y.o. year old G1P0 female at [redacted]w[redacted]d weeks gestation who presents to MAU reporting LT upper chest wall pain that radiates down LT arm into 4th and 5th digits; causing numbness in those 2 digits. She reports the chest pain as "tightness"; rated 8/10. She has not taken anything for pain. She reports SOB.  *Pt noted to have unlabored breathing and O2 Sat of 100% on RA per triage RN note.   Past Medical History:  Diagnosis Date  . Allergy   . Migraine   . Ovarian cyst     Past Surgical History:  Procedure Laterality Date  . WISDOM TOOTH EXTRACTION      History reviewed. No pertinent family history.  Social History   Tobacco Use  . Smoking status: Never Smoker  . Smokeless tobacco: Never Used  Substance Use Topics  . Alcohol use: Yes    Alcohol/week: 0.0 oz    Comment: occasion  . Drug use: No    Allergies: No Known Allergies  Medications Prior to Admission  Medication Sig Dispense Refill Last Dose  . acetaminophen (TYLENOL) 325 MG tablet Take 650 mg by mouth every 6 (six) hours as needed for headache.   Past Month at Unknown time  . ibuprofen (ADVIL,MOTRIN) 600 MG tablet Take 1 tablet (600 mg total) by mouth every 6 (six) hours as needed. 12 tablet 0 08/25/2017 at Unknown time  . loratadine (CLARITIN) 10 MG tablet Take 1 tablet (10 mg total) by mouth daily. 30 tablet 11 08/26/2017 at Unknown time  . prenatal vitamin w/FE, FA (PRENATAL 1 + 1) 27-1 MG TABS tablet Take 1 tablet by mouth daily before breakfast. 90 each 4 08/26/2017 at Unknown time  . traMADol (ULTRAM) 50 MG tablet Take 1 tablet (50 mg total) by mouth every 6 (six) hours as needed. 10 tablet 0 Past Week at Unknown time  . valACYclovir (VALTREX) 500 MG tablet Take  500 mg by mouth daily as needed. Pt takes when she has outbreaks   Past Month at Unknown time    Review of Systems  Constitutional: Negative.   HENT: Negative.   Eyes: Negative.   Respiratory: Positive for shortness of breath.   Cardiovascular: Positive for chest pain.  Gastrointestinal: Positive for constipation ("did have a BM this AM after taking Miralax yesterday").  Endocrine: Negative.   Genitourinary: Negative.   Musculoskeletal: Positive for back pain (LT upper back over shoulder blade).       Pain in LT shoulder blade  Skin: Negative.   Allergic/Immunologic: Negative.   Neurological: Negative.   Hematological: Negative.   Psychiatric/Behavioral: Negative.    Physical Exam   Blood pressure (!) 120/59, pulse 93, temperature 98.7 F (37.1 C), temperature source Oral, resp. rate 17, height  (1.6 m), weight 161 lb 1.3 oz (73.1 kg), last menstrual period 05/02/2017, SpO2 100 %.  Physical Exam  Nursing note and vitals reviewed. Constitutional: She is oriented to person, place, and time. She appears well-developed and well-nourished.  HENT:  Head: Normocephalic and atraumatic.  Eyes: Pupils are equal, round, and reactive to light.  Neck: Normal range of motion.  Cardiovascular: Normal rate, regular rhythm and normal heart sounds.  Respiratory: Effort normal and breath sounds normal. No respiratory distress. She has no wheezes. She has no rales.     She exhibits no tenderness.  See graphic for location of scapular tenderness; pain and complaints are c/w gas pain  GI: Soft. Bowel sounds are normal.  Genitourinary:  Genitourinary Comments: Pelvic deferred  Musculoskeletal: Normal range of motion.  Neurological: She is alert and oriented to person, place, and time.  Skin: Skin is warm and dry.  Psychiatric: She has a normal mood and affect. Her behavior is normal. Judgment and thought content normal.    MAU Course  Procedures  MDM [facility] EKG -- Normal EKG / Normal  sinus rhythm Flexeril 5 mg po -- minimal improvement G.I. Cocktail -- minimal improvement  *Consult with Dr. Macon Large @ 0900 - notified of patient's complaints, assessments, lab results, tx plan EKG, G.I. Cocktail,  - ok to d/c home, agrees with plan  Results for orders placed or performed during the hospital encounter of 08/26/17 (from the past 24 hour(s))  Urinalysis, Routine w reflex microscopic     Status: None   Collection Time: 08/26/17  8:30 AM  Result Value Ref Range   Color, Urine YELLOW YELLOW   APPearance CLEAR CLEAR   Specific Gravity, Urine 1.011 1.005 - 1.030   pH 7.0 5.0 - 8.0   Glucose, UA NEGATIVE NEGATIVE mg/dL   Hgb urine dipstick NEGATIVE NEGATIVE   Bilirubin Urine NEGATIVE NEGATIVE   Ketones, ur NEGATIVE NEGATIVE mg/dL   Protein, ur NEGATIVE NEGATIVE mg/dL   Nitrite NEGATIVE NEGATIVE   Leukocytes, UA NEGATIVE NEGATIVE     Assessment and Plan  Constipation during pregnancy in second trimester  - Continue Poop regimen started yesterday - Information provided on constipation & high fiber diet   Gas pain  - Information provided on heartburn - Advised to take OTC medications for heartburn prn   - Discharge patient - Keep scheduled appt on 5/15 with [facility] - Patient verbalized an understanding of the plan of care and agrees.     Kristy Mora, MSN, CNM 08/26/2017, 9:10 AM

## 2017-08-28 ENCOUNTER — Encounter: Payer: Medicaid Other | Admitting: Obstetrics

## 2017-08-29 ENCOUNTER — Ambulatory Visit (INDEPENDENT_AMBULATORY_CARE_PROVIDER_SITE_OTHER): Payer: Medicaid Other | Admitting: Obstetrics

## 2017-08-29 ENCOUNTER — Other Ambulatory Visit: Payer: Self-pay

## 2017-08-29 ENCOUNTER — Encounter: Payer: Self-pay | Admitting: Obstetrics

## 2017-08-29 VITALS — BP 119/69 | HR 87 | Wt 161.0 lb

## 2017-08-29 DIAGNOSIS — Z3490 Encounter for supervision of normal pregnancy, unspecified, unspecified trimester: Secondary | ICD-10-CM

## 2017-08-29 DIAGNOSIS — Z3492 Encounter for supervision of normal pregnancy, unspecified, second trimester: Secondary | ICD-10-CM

## 2017-08-29 NOTE — Progress Notes (Signed)
Subjective:  Kristy Davis is a 27 y.o. G1P0 at [redacted]w[redacted]d being seen today for ongoing prenatal care.  She is currently monitored for the following issues for this low-risk pregnancy and has Hydrosalpinx; Migraine; Supervision of normal pregnancy, antepartum; Constipation during pregnancy in second trimester; and Gas pain on their problem list.  Patient reports no complaints.  Contractions: Not present. Vag. Bleeding: None.  Movement: Present. Denies leaking of fluid.   The following portions of the patient's history were reviewed and updated as appropriate: allergies, current medications, past family history, past medical history, past social history, past surgical history and problem list. Problem list updated.  Objective:   Vitals:   08/29/17 1009  BP: 119/69  Pulse: 87  Weight: 161 lb (73 kg)    Fetal Status: Fetal Heart Rate (bpm): 150   Movement: Present     General:  Alert, oriented and cooperative. Patient is in no acute distress.  Skin: Skin is warm and dry. No rash noted.   Cardiovascular: Normal heart rate noted  Respiratory: Normal respiratory effort, no problems with respiration noted  Abdomen: Soft, gravid, appropriate for gestational age. Pain/Pressure: Present     Pelvic:  Cervical exam deferred        Extremities: Normal range of motion.  Edema: Trace  Mental Status: Normal mood and affect. Normal behavior. Normal judgment and thought content.   Urinalysis:      Assessment and Plan:  Pregnancy: G1P0 at [redacted]w[redacted]d  1. Encounter for supervision of normal pregnancy, antepartum, unspecified gravidity Rx: - AFP, Serum, Open Spina Bifida - [place] MFM OB COMP + 14 WK; Future  Preterm labor symptoms and general obstetric precautions including but not limited to vaginal bleeding, contractions, leaking of fluid and fetal movement were reviewed in detail with the patient. Please refer to After Visit Summary for other counseling recommendations.  Return in about 1 month (around  09/26/2017) for ROB.   Brock Bad, MD

## 2017-08-29 NOTE — Progress Notes (Signed)
ROB/AFP 

## 2017-08-31 LAB — AFP, SERUM, OPEN SPINA BIFIDA
AFP MOM: 1.08
AFP VALUE AFPOSL: 42 ng/mL
Gest. Age on Collection Date: 17 weeks
MATERNAL AGE AT EDD: 29 a
OSBR Risk 1 IN: 10000
TEST RESULTS AFP: NEGATIVE
Weight: 161 [lb_av]

## 2017-09-07 ENCOUNTER — Encounter (HOSPITAL_COMMUNITY): Payer: Self-pay

## 2017-09-14 ENCOUNTER — Other Ambulatory Visit (HOSPITAL_COMMUNITY): Payer: Self-pay | Admitting: *Deleted

## 2017-09-14 ENCOUNTER — Other Ambulatory Visit: Payer: Self-pay | Admitting: Obstetrics

## 2017-09-14 ENCOUNTER — Ambulatory Visit (HOSPITAL_COMMUNITY)
Admission: RE | Admit: 2017-09-14 | Discharge: 2017-09-14 | Disposition: A | Discharge status: Home / Self Care | Payer: Medicaid Other | Source: Ambulatory Visit | Attending: Obstetrics | Admitting: Obstetrics

## 2017-09-14 DIAGNOSIS — O4432 Partial placenta previa with hemorrhage, second trimester: Secondary | ICD-10-CM

## 2017-09-14 DIAGNOSIS — O4402 Placenta previa specified as without hemorrhage, second trimester: Secondary | ICD-10-CM | POA: Insufficient documentation

## 2017-09-14 DIAGNOSIS — Z3A19 19 weeks gestation of pregnancy: Secondary | ICD-10-CM | POA: Diagnosis not present

## 2017-09-14 DIAGNOSIS — Z3689 Encounter for other specified antenatal screening: Secondary | ICD-10-CM

## 2017-09-14 DIAGNOSIS — Z3490 Encounter for supervision of normal pregnancy, unspecified, unspecified trimester: Secondary | ICD-10-CM

## 2017-09-26 ENCOUNTER — Ambulatory Visit (INDEPENDENT_AMBULATORY_CARE_PROVIDER_SITE_OTHER): Payer: Medicaid Other | Admitting: Obstetrics

## 2017-09-26 ENCOUNTER — Encounter: Payer: Self-pay | Admitting: Obstetrics

## 2017-09-26 VITALS — BP 126/74 | HR 102 | Wt 163.6 lb

## 2017-09-26 DIAGNOSIS — O4422 Partial placenta previa NOS or without hemorrhage, second trimester: Secondary | ICD-10-CM

## 2017-09-26 DIAGNOSIS — Z3402 Encounter for supervision of normal first pregnancy, second trimester: Secondary | ICD-10-CM

## 2017-09-26 DIAGNOSIS — O4420 Partial placenta previa NOS or without hemorrhage, unspecified trimester: Secondary | ICD-10-CM

## 2017-09-26 DIAGNOSIS — Z3490 Encounter for supervision of normal pregnancy, unspecified, unspecified trimester: Secondary | ICD-10-CM

## 2017-09-26 NOTE — Progress Notes (Signed)
Pt c/o allergy sx's Claritin not helping.  Wants to discuss anatomy U/S results. Placenta Previa noted on scan.

## 2017-09-26 NOTE — Progress Notes (Signed)
Subjective:  Kristy Davis is a 27 y.o. G1P0 at 291w0d being seen today for ongoing prenatal care.  She is currently monitored for the following issues for this high-risk pregnancy and has Hydrosalpinx; Migraine; Supervision of normal pregnancy, antepartum; Constipation during pregnancy in second trimester; Gas pain; [redacted] weeks gestation of pregnancy; Encounter for fetal anatomic survey; and Placenta previa antepartum in second trimester on their problem list.  Patient reports no complaints.  Contractions: Not present. Vag. Bleeding: None.  Movement: Present. Denies leaking of fluid.   The following portions of the patient's history were reviewed and updated as appropriate: allergies, current medications, past family history, past medical history, past social history, past surgical history and problem list. Problem list updated.  Objective:   Vitals:   09/26/17 0858  BP: 126/74  Pulse: (!) 102  Weight: 163 lb 9.6 oz (74.2 kg)    Fetal Status: Fetal Heart Rate (bpm): 150   Movement: Present     General:  Alert, oriented and cooperative. Patient is in no acute distress.  Skin: Skin is warm and dry. No rash noted.   Cardiovascular: Normal heart rate noted  Respiratory: Normal respiratory effort, no problems with respiration noted  Abdomen: Soft, gravid, appropriate for gestational age. Pain/Pressure: Absent     Pelvic:  Cervical exam deferred        Extremities: Normal range of motion.  Edema: None  Mental Status: Normal mood and affect. Normal behavior. Normal judgment and thought content.   Urinalysis:      Assessment and Plan:  Pregnancy: G1P0 at 291w0d  1. Encounter for supervision of normal pregnancy, antepartum, unspecified gravidity  2. Placenta previa marginalis - previa precautions - repeat ultrasound at 28 weeks   Preterm labor symptoms and general obstetric precautions including but not limited to vaginal bleeding, contractions, leaking of fluid and fetal movement were  reviewed in detail with the patient. Please refer to After Visit Summary for other counseling recommendations.  Return in about 1 month (around 10/24/2017) for ROB.   Brock BadHarper, Raisa Ditto A, MD

## 2017-10-24 ENCOUNTER — Ambulatory Visit (INDEPENDENT_AMBULATORY_CARE_PROVIDER_SITE_OTHER): Payer: Medicaid Other | Admitting: Certified Nurse Midwife

## 2017-10-24 VITALS — BP 131/80 | HR 106 | Wt 170.2 lb

## 2017-10-24 DIAGNOSIS — Z3490 Encounter for supervision of normal pregnancy, unspecified, unspecified trimester: Secondary | ICD-10-CM

## 2017-10-24 DIAGNOSIS — O4402 Placenta previa specified as without hemorrhage, second trimester: Secondary | ICD-10-CM

## 2017-10-24 NOTE — Progress Notes (Signed)
Patient reports good fetal movement, denies pain. 

## 2017-10-24 NOTE — Progress Notes (Signed)
   PRENATAL VISIT NOTE  Subjective:  Kristy Davis is a 27 y.o. G1P0 at 4733w0d being seen today for ongoing prenatal care.  She is currently monitored for the following issues for this low-risk pregnancy and has Hydrosalpinx; Migraine; Supervision of normal pregnancy, antepartum; Constipation during pregnancy in second trimester; Gas pain; [redacted] weeks gestation of pregnancy; Encounter for fetal anatomic survey; and Placenta previa antepartum in second trimester on their problem list.  Patient reports no complaints.  Contractions: Not present. Vag. Bleeding: None.  Movement: Present. Denies leaking of fluid.   The following portions of the patient's history were reviewed and updated as appropriate: allergies, current medications, past family history, past medical history, past social history, past surgical history and problem list. Problem list updated.  Objective:   Vitals:   10/24/17 0849  BP: 131/80  Pulse: (!) 106  Weight: 170 lb 3.2 oz (77.2 kg)    Fetal Status: Fetal Heart Rate (bpm): 148; doppler Fundal Height: 24 cm Movement: Present     General:  Alert, oriented and cooperative. Patient is in no acute distress.  Skin: Skin is warm and dry. No rash noted.   Cardiovascular: Normal heart rate noted  Respiratory: Normal respiratory effort, no problems with respiration noted  Abdomen: Soft, gravid, appropriate for gestational age.  Pain/Pressure: Absent     Pelvic: Cervical exam deferred        Extremities: Normal range of motion.  Edema: Trace  Mental Status: Normal mood and affect. Normal behavior. Normal judgment and thought content.   Assessment and Plan:  Pregnancy: G1P0 at 5633w0d  1. Encounter for supervision of normal pregnancy, antepartum, unspecified gravidity    Doing well.  Has f/u US scheduled for 10/26/17, has been compliant with pelvic rest.  2. Placenta previa antepartum in second trimester       Preterm labor symptoms and general obstetric precautions including  but not limited to vaginal bleeding, contractions, leaking of fluid and fetal movement were reviewed in detail with the patient. Please refer to After Visit Summary for other counseling recommendations.  Return in about 3 weeks (around 11/14/2017) for ROB, 2 hr OGTT.  Future Appointments  Date Time Provider Department Center  10/26/2017  7:45 AM [facility] US 2 [facility]  11/14/2017  8:15 AM [facility] None  11/14/2017  8:30 AM Leftwich-Kirby, Wilmer FloorLisa A, CNM [facility] None    Roe Coombsachelle A Brayant Dorr, CNM

## 2017-10-26 ENCOUNTER — Other Ambulatory Visit (HOSPITAL_COMMUNITY): Payer: Self-pay | Admitting: Obstetrics and Gynecology

## 2017-10-26 ENCOUNTER — Encounter (HOSPITAL_COMMUNITY): Payer: Self-pay

## 2017-10-26 ENCOUNTER — Ambulatory Visit (HOSPITAL_COMMUNITY)
Admission: RE | Admit: 2017-10-26 | Discharge: 2017-10-26 | Disposition: A | Discharge status: Home / Self Care | Payer: Medicaid Other | Source: Ambulatory Visit | Attending: Obstetrics | Admitting: Obstetrics

## 2017-10-26 DIAGNOSIS — Z362 Encounter for other antenatal screening follow-up: Secondary | ICD-10-CM | POA: Diagnosis not present

## 2017-10-26 DIAGNOSIS — Z3A25 25 weeks gestation of pregnancy: Secondary | ICD-10-CM | POA: Insufficient documentation

## 2017-10-26 DIAGNOSIS — O4432 Partial placenta previa with hemorrhage, second trimester: Secondary | ICD-10-CM | POA: Diagnosis not present

## 2017-11-14 ENCOUNTER — Other Ambulatory Visit: Payer: Medicaid Other

## 2017-11-14 ENCOUNTER — Ambulatory Visit (INDEPENDENT_AMBULATORY_CARE_PROVIDER_SITE_OTHER): Payer: Medicaid Other | Admitting: Advanced Practice Midwife

## 2017-11-14 ENCOUNTER — Other Ambulatory Visit: Payer: Self-pay

## 2017-11-14 VITALS — BP 123/70 | HR 96 | Wt 173.0 lb

## 2017-11-14 DIAGNOSIS — N6011 Diffuse cystic mastopathy of right breast: Secondary | ICD-10-CM

## 2017-11-14 DIAGNOSIS — Z3490 Encounter for supervision of normal pregnancy, unspecified, unspecified trimester: Secondary | ICD-10-CM

## 2017-11-14 DIAGNOSIS — N6012 Diffuse cystic mastopathy of left breast: Secondary | ICD-10-CM

## 2017-11-14 DIAGNOSIS — O4402 Placenta previa specified as without hemorrhage, second trimester: Secondary | ICD-10-CM

## 2017-11-14 DIAGNOSIS — Z3403 Encounter for supervision of normal first pregnancy, third trimester: Secondary | ICD-10-CM

## 2017-11-14 DIAGNOSIS — N63 Unspecified lump in unspecified breast: Secondary | ICD-10-CM

## 2017-11-14 NOTE — Progress Notes (Signed)
   PRENATAL VISIT NOTE  Subjective:  Kristy Davis is a 27 y.o. G1P0 at 623w0d being seen today for ongoing prenatal care.  She is currently monitored for the following issues for this low-risk pregnancy and has Hydrosalpinx; Migraine; Supervision of normal pregnancy, antepartum; Constipation during pregnancy in second trimester; Gas pain; Encounter for fetal anatomic survey; and Placenta previa antepartum in second trimester on their problem list.  Patient reports breast lumps felt in both breasts that move around, seem to be related to how her bra fits.  Contractions: Not present. Vag. Bleeding: None.  Movement: Present. Denies leaking of fluid.   The following portions of the patient's history were reviewed and updated as appropriate: allergies, current medications, past family history, past medical history, past social history, past surgical history and problem list. Problem list updated.  Objective:   Vitals:   11/14/17 0824  BP: 123/70  Pulse: 96  Weight: 173 lb (78.5 kg)    Fetal Status:     Movement: Present     General:  Alert, oriented and cooperative. Patient is in no acute distress.  Skin: Skin is warm and dry. No rash noted.   Cardiovascular: Normal heart rate noted  Respiratory: Normal respiratory effort, no problems with respiration noted  Abdomen: Soft, gravid, appropriate for gestational age.  Pain/Pressure: Present     Pelvic: Cervical exam deferred        Extremities: Normal range of motion.  Edema: Trace  Mental Status: Normal mood and affect. Normal behavior. Normal judgment and thought content.   Assessment and Plan:  Pregnancy: G1P0 at [redacted]w[redacted]d  1. Encounter for supervision of normal pregnancy, antepartum, unspecified gravidity --Anticipatory guidance about next visits/weeks of pregnancy given. - Glucose Tolerance, 2 Hours w/1 Hour - CBC - HIV antibody (with reflex) - RPR  2. Placenta previa antepartum in second trimester --Resolved 10/26/17  3. Breast  mass --Benign fibrocystic breast change palpable bilaterally. Imaging for further evaluation. - US BREAST LTD UNI RIGHT INC AXILLA; Future - US BREAST LTD UNI LEFT INC AXILLA; Future  4. Fibrocystic breast changes of both breasts --See above - US BREAST LTD UNI RIGHT INC AXILLA; Future - US BREAST LTD UNI LEFT INC AXILLA; Future  Preterm labor symptoms and general obstetric precautions including but not limited to vaginal bleeding, contractions, leaking of fluid and fetal movement were reviewed in detail with the patient. Please refer to After Visit Summary for other counseling recommendations.  Return in about 2 weeks (around 11/28/2017).  No future appointments.  Sharen CounterLisa Leftwich-Kirby, CNM

## 2017-11-14 NOTE — Patient Instructions (Signed)
Third Trimester of Pregnancy The third trimester is from week 28 through week 40 (months 7 through 9). The third trimester is a time when the unborn baby (fetus) is growing rapidly. At the end of the ninth month, the fetus is about 20 inches in length and weighs 6-10 pounds. Body changes during your third trimester Your body will continue to go through many changes during pregnancy. The changes vary from woman to woman. During the third trimester:  Your weight will continue to increase. You can expect to gain 25-35 pounds (11-16 kg) by the end of the pregnancy.  You may begin to get stretch marks on your hips, abdomen, and breasts.  You may urinate more often because the fetus is moving lower into your pelvis and pressing on your bladder.  You may develop or continue to have heartburn. This is caused by increased hormones that slow down muscles in the digestive tract.  You may develop or continue to have constipation because increased hormones slow digestion and cause the muscles that push waste through your intestines to relax.  You may develop hemorrhoids. These are swollen veins (varicose veins) in the rectum that can itch or be painful.  You may develop swollen, bulging veins (varicose veins) in your legs.  You may have increased body aches in the pelvis, back, or thighs. This is due to weight gain and increased hormones that are relaxing your joints.  You may have changes in your hair. These can include thickening of your hair, rapid growth, and changes in texture. Some women also have hair loss during or after pregnancy, or hair that feels dry or thin. Your hair will most likely return to normal after your baby is born.  Your breasts will continue to grow and they will continue to become tender. A yellow fluid (colostrum) may leak from your breasts. This is the first milk you are producing for your baby.  Your belly button may stick out.  You may notice more swelling in your hands,  face, or ankles.  You may have increased tingling or numbness in your hands, arms, and legs. The skin on your belly may also feel numb.  You may feel short of breath because of your expanding uterus.  You may have more problems sleeping. This can be caused by the size of your belly, increased need to urinate, and an increase in your body's metabolism.  You may notice the fetus "dropping," or moving lower in your abdomen (lightening).  You may have increased vaginal discharge.  You may notice your joints feel loose and you may have pain around your pelvic bone.  What to expect at prenatal visits You will have prenatal exams every 2 weeks until week 36. Then you will have weekly prenatal exams. During a routine prenatal visit:  You will be weighed to make sure you and the baby are growing normally.  Your blood pressure will be taken.  Your abdomen will be measured to track your baby's growth.  The fetal heartbeat will be listened to.  Any test results from the previous visit will be discussed.  You may have a cervical check near your due date to see if your cervix has softened or thinned (effaced).  You will be tested for Group B streptococcus. This happens between 35 and 37 weeks.  Your health care provider may ask you:  What your birth plan is.  How you are feeling.  If you are feeling the baby move.  If you have had   any abnormal symptoms, such as leaking fluid, bleeding, severe headaches, or abdominal cramping.  If you are using any tobacco products, including cigarettes, chewing tobacco, and electronic cigarettes.  If you have any questions.  Other tests or screenings that may be performed during your third trimester include:  Blood tests that check for low iron levels (anemia).  Fetal testing to check the health, activity level, and growth of the fetus. Testing is done if you have certain medical conditions or if there are problems during the  pregnancy.  Nonstress test (NST). This test checks the health of your baby to make sure there are no signs of problems, such as the baby not getting enough oxygen. During this test, a belt is placed around your belly. The baby is made to move, and its heart rate is monitored during movement.  What is false labor? False labor is a condition in which you feel small, irregular tightenings of the muscles in the womb (contractions) that usually go away with rest, changing position, or drinking water. These are called Braxton Hicks contractions. Contractions may last for hours, days, or even weeks before true labor sets in. If contractions come at regular intervals, become more frequent, increase in intensity, or become painful, you should see your health care provider. What are the signs of labor?  Abdominal cramps.  Regular contractions that start at 10 minutes apart and become stronger and more frequent with time.  Contractions that start on the top of the uterus and spread down to the lower abdomen and back.  Increased pelvic pressure and dull back pain.  A watery or bloody mucus discharge that comes from the vagina.  Leaking of amniotic fluid. This is also known as your "water breaking." It could be a slow trickle or a gush. Let your health care provider know if it has a color or strange odor. If you have any of these signs, call your health care provider right away, even if it is before your due date. Follow these instructions at home: Medicines  Follow your health care provider's instructions regarding medicine use. Specific medicines may be either safe or unsafe to take during pregnancy.  Take a prenatal vitamin that contains at least 600 micrograms (mcg) of folic acid.  If you develop constipation, try taking a stool softener if your health care provider approves. Eating and drinking  Eat a balanced diet that includes fresh fruits and vegetables, whole grains, good sources of protein  such as meat, eggs, or tofu, and low-fat dairy. Your health care provider will help you determine the amount of weight gain that is right for you.  Avoid raw meat and uncooked cheese. These carry germs that can cause birth defects in the baby.  If you have low calcium intake from food, talk to your health care provider about whether you should take a daily calcium supplement.  Eat four or five small meals rather than three large meals a day.  Limit foods that are high in fat and processed sugars, such as fried and sweet foods.  To prevent constipation: ? Drink enough fluid to keep your urine clear or pale yellow. ? Eat foods that are high in fiber, such as fresh fruits and vegetables, whole grains, and beans. Activity  Exercise only as directed by your health care provider. Most women can continue their usual exercise routine during pregnancy. Try to exercise for 30 minutes at least 5 days a week. Stop exercising if you experience uterine contractions.  Avoid heavy   lifting.  Do not exercise in extreme heat or humidity, or at high altitudes.  Wear low-heel, comfortable shoes.  Practice good posture.  You may continue to have sex unless your health care provider tells you otherwise. Relieving pain and discomfort  Take frequent breaks and rest with your legs elevated if you have leg cramps or low back pain.  Take warm sitz baths to soothe any pain or discomfort caused by hemorrhoids. Use hemorrhoid cream if your health care provider approves.  Wear a good support bra to prevent discomfort from breast tenderness.  If you develop varicose veins: ? Wear support pantyhose or compression stockings as told by your healthcare provider. ? Elevate your feet for 15 minutes, 3-4 times a day. Prenatal care  Write down your questions. Take them to your prenatal visits.  Keep all your prenatal visits as told by your health care provider. This is important. Safety  Wear your seat belt at  all times when driving.  Make a list of emergency phone numbers, including numbers for family, friends, the hospital, and police and fire departments. General instructions  Avoid cat litter boxes and soil used by cats. These carry germs that can cause birth defects in the baby. If you have a cat, ask someone to clean the litter box for you.  Do not travel far distances unless it is absolutely necessary and only with the approval of your health care provider.  Do not use hot tubs, steam rooms, or saunas.  Do not drink alcohol.  Do not use any products that contain nicotine or tobacco, such as cigarettes and e-cigarettes. If you need help quitting, ask your health care provider.  Do not use any medicinal herbs or unprescribed drugs. These chemicals affect the formation and growth of the baby.  Do not douche or use tampons or scented sanitary pads.  Do not cross your legs for long periods of time.  To prepare for the arrival of your baby: ? Take prenatal classes to understand, practice, and ask questions about labor and delivery. ? Make a trial run to the hospital. ? Visit the hospital and tour the maternity area. ? Arrange for maternity or paternity leave through employers. ? Arrange for family and friends to take care of pets while you are in the hospital. ? Purchase a rear-facing car seat and make sure you know how to install it in your car. ? Pack your hospital bag. ? Prepare the baby's nursery. Make sure to remove all pillows and stuffed animals from the baby's crib to prevent suffocation.  Visit your dentist if you have not gone during your pregnancy. Use a soft toothbrush to brush your teeth and be gentle when you floss. Contact a health care provider if:  You are unsure if you are in labor or if your water has broken.  You become dizzy.  You have mild pelvic cramps, pelvic pressure, or nagging pain in your abdominal area.  You have lower back pain.  You have persistent  nausea, vomiting, or diarrhea.  You have an unusual or bad smelling vaginal discharge.  You have pain when you urinate. Get help right away if:  Your water breaks before 37 weeks.  You have regular contractions less than 5 minutes apart before 37 weeks.  You have a fever.  You are leaking fluid from your vagina.  You have spotting or bleeding from your vagina.  You have severe abdominal pain or cramping.  You have rapid weight loss or weight gain.    You have shortness of breath with chest pain.  You notice sudden or extreme swelling of your face, hands, ankles, feet, or legs.  Your baby makes fewer than 10 movements in 2 hours.  You have severe headaches that do not go away when you take medicine.  You have vision changes. Summary  The third trimester is from week 28 through week 40, months 7 through 9. The third trimester is a time when the unborn baby (fetus) is growing rapidly.  During the third trimester, your discomfort may increase as you and your baby continue to gain weight. You may have abdominal, leg, and back pain, sleeping problems, and an increased need to urinate.  During the third trimester your breasts will keep growing and they will continue to become tender. A yellow fluid (colostrum) may leak from your breasts. This is the first milk you are producing for your baby.  False labor is a condition in which you feel small, irregular tightenings of the muscles in the womb (contractions) that eventually go away. These are called Braxton Hicks contractions. Contractions may last for hours, days, or even weeks before true labor sets in.  Signs of labor can include: abdominal cramps; regular contractions that start at 10 minutes apart and become stronger and more frequent with time; watery or bloody mucus discharge that comes from the vagina; increased pelvic pressure and dull back pain; and leaking of amniotic fluid. This information is not intended to replace advice  given to you by your health care provider. Make sure you discuss any questions you have with your health care provider. Document Released: 03/28/2001 Document Revised: 09/09/2015 Document Reviewed: 06/04/2012 Elsevier Interactive Patient Education  2017 [employer]  

## 2017-11-14 NOTE — Progress Notes (Signed)
ROB/GTT.  Declined TDAP.  C/o breasts getting "clumpy" when she wears a bra. Leaks clear, creamy, oily fluid.  Denies pain x 2 months.

## 2017-11-15 LAB — CBC
HEMATOCRIT: 32.2 % — AB (ref 34.0–46.6)
HEMOGLOBIN: 10.5 g/dL — AB (ref 11.1–15.9)
MCH: 29.2 pg (ref 26.6–33.0)
MCHC: 32.6 g/dL (ref 31.5–35.7)
MCV: 90 fL (ref 79–97)
Platelets: 183 10*3/uL (ref 150–450)
RBC: 3.59 x10E6/uL — ABNORMAL LOW (ref 3.77–5.28)
RDW: 13.2 % (ref 12.3–15.4)
WBC: 10.5 10*3/uL (ref 3.4–10.8)

## 2017-11-15 LAB — HIV ANTIBODY (ROUTINE TESTING W REFLEX): HIV Screen 4th Generation wRfx: NONREACTIVE

## 2017-11-15 LAB — RPR: RPR: NONREACTIVE

## 2017-11-15 LAB — GLUCOSE TOLERANCE, 2 HOURS W/ 1HR
GLUCOSE, 2 HOUR: 97 mg/dL (ref 65–152)
Glucose, 1 hour: 107 mg/dL (ref 65–179)
Glucose, Fasting: 88 mg/dL (ref 65–91)

## 2017-11-22 ENCOUNTER — Ambulatory Visit
Admission: RE | Admit: 2017-11-22 | Discharge: 2017-11-22 | Disposition: A | Discharge status: Home / Self Care | Payer: Medicaid Other | Source: Ambulatory Visit | Attending: Advanced Practice Midwife | Admitting: Advanced Practice Midwife

## 2017-11-22 DIAGNOSIS — N63 Unspecified lump in unspecified breast: Secondary | ICD-10-CM

## 2017-11-22 DIAGNOSIS — N6012 Diffuse cystic mastopathy of left breast: Secondary | ICD-10-CM

## 2017-11-22 DIAGNOSIS — N6011 Diffuse cystic mastopathy of right breast: Secondary | ICD-10-CM

## 2017-11-29 ENCOUNTER — Encounter: Payer: Self-pay | Admitting: Obstetrics and Gynecology

## 2017-11-29 ENCOUNTER — Ambulatory Visit (INDEPENDENT_AMBULATORY_CARE_PROVIDER_SITE_OTHER): Payer: Medicaid Other | Admitting: Obstetrics and Gynecology

## 2017-11-29 DIAGNOSIS — Z3400 Encounter for supervision of normal first pregnancy, unspecified trimester: Secondary | ICD-10-CM

## 2017-11-29 NOTE — Patient Instructions (Signed)
Third Trimester of Pregnancy The third trimester is from week 28 through week 40 (months 7 through 9). The third trimester is a time when the unborn baby (fetus) is growing rapidly. At the end of the ninth month, the fetus is about 20 inches in length and weighs 6-10 pounds. Body changes during your third trimester Your body will continue to go through many changes during pregnancy. The changes vary from woman to woman. During the third trimester:  Your weight will continue to increase. You can expect to gain 25-35 pounds (11-16 kg) by the end of the pregnancy.  You may begin to get stretch marks on your hips, abdomen, and breasts.  You may urinate more often because the fetus is moving lower into your pelvis and pressing on your bladder.  You may develop or continue to have heartburn. This is caused by increased hormones that slow down muscles in the digestive tract.  You may develop or continue to have constipation because increased hormones slow digestion and cause the muscles that push waste through your intestines to relax.  You may develop hemorrhoids. These are swollen veins (varicose veins) in the rectum that can itch or be painful.  You may develop swollen, bulging veins (varicose veins) in your legs.  You may have increased body aches in the pelvis, back, or thighs. This is due to weight gain and increased hormones that are relaxing your joints.  You may have changes in your hair. These can include thickening of your hair, rapid growth, and changes in texture. Some women also have hair loss during or after pregnancy, or hair that feels dry or thin. Your hair will most likely return to normal after your baby is born.  Your breasts will continue to grow and they will continue to become tender. A yellow fluid (colostrum) may leak from your breasts. This is the first milk you are producing for your baby.  Your belly button may stick out.  You may notice more swelling in your hands,  face, or ankles.  You may have increased tingling or numbness in your hands, arms, and legs. The skin on your belly may also feel numb.  You may feel short of breath because of your expanding uterus.  You may have more problems sleeping. This can be caused by the size of your belly, increased need to urinate, and an increase in your body's metabolism.  You may notice the fetus "dropping," or moving lower in your abdomen (lightening).  You may have increased vaginal discharge.  You may notice your joints feel loose and you may have pain around your pelvic bone.  What to expect at prenatal visits You will have prenatal exams every 2 weeks until week 36. Then you will have weekly prenatal exams. During a routine prenatal visit:  You will be weighed to make sure you and the baby are growing normally.  Your blood pressure will be taken.  Your abdomen will be measured to track your baby's growth.  The fetal heartbeat will be listened to.  Any test results from the previous visit will be discussed.  You may have a cervical check near your due date to see if your cervix has softened or thinned (effaced).  You will be tested for Group B streptococcus. This happens between 35 and 37 weeks.  Your health care provider may ask you:  What your birth plan is.  How you are feeling.  If you are feeling the baby move.  If you have had   any abnormal symptoms, such as leaking fluid, bleeding, severe headaches, or abdominal cramping.  If you are using any tobacco products, including cigarettes, chewing tobacco, and electronic cigarettes.  If you have any questions.  Other tests or screenings that may be performed during your third trimester include:  Blood tests that check for low iron levels (anemia).  Fetal testing to check the health, activity level, and growth of the fetus. Testing is done if you have certain medical conditions or if there are problems during the  pregnancy.  Nonstress test (NST). This test checks the health of your baby to make sure there are no signs of problems, such as the baby not getting enough oxygen. During this test, a belt is placed around your belly. The baby is made to move, and its heart rate is monitored during movement.  What is false labor? False labor is a condition in which you feel small, irregular tightenings of the muscles in the womb (contractions) that usually go away with rest, changing position, or drinking water. These are called Braxton Hicks contractions. Contractions may last for hours, days, or even weeks before true labor sets in. If contractions come at regular intervals, become more frequent, increase in intensity, or become painful, you should see your health care provider. What are the signs of labor?  Abdominal cramps.  Regular contractions that start at 10 minutes apart and become stronger and more frequent with time.  Contractions that start on the top of the uterus and spread down to the lower abdomen and back.  Increased pelvic pressure and dull back pain.  A watery or bloody mucus discharge that comes from the vagina.  Leaking of amniotic fluid. This is also known as your "water breaking." It could be a slow trickle or a gush. Let your health care provider know if it has a color or strange odor. If you have any of these signs, call your health care provider right away, even if it is before your due date. Follow these instructions at home: Medicines  Follow your health care provider's instructions regarding medicine use. Specific medicines may be either safe or unsafe to take during pregnancy.  Take a prenatal vitamin that contains at least 600 micrograms (mcg) of folic acid.  If you develop constipation, try taking a stool softener if your health care provider approves. Eating and drinking  Eat a balanced diet that includes fresh fruits and vegetables, whole grains, good sources of protein  such as meat, eggs, or tofu, and low-fat dairy. Your health care provider will help you determine the amount of weight gain that is right for you.  Avoid raw meat and uncooked cheese. These carry germs that can cause birth defects in the baby.  If you have low calcium intake from food, talk to your health care provider about whether you should take a daily calcium supplement.  Eat four or five small meals rather than three large meals a day.  Limit foods that are high in fat and processed sugars, such as fried and sweet foods.  To prevent constipation: ? Drink enough fluid to keep your urine clear or pale yellow. ? Eat foods that are high in fiber, such as fresh fruits and vegetables, whole grains, and beans. Activity  Exercise only as directed by your health care provider. Most women can continue their usual exercise routine during pregnancy. Try to exercise for 30 minutes at least 5 days a week. Stop exercising if you experience uterine contractions.  Avoid heavy   lifting.  Do not exercise in extreme heat or humidity, or at high altitudes.  Wear low-heel, comfortable shoes.  Practice good posture.  You may continue to have sex unless your health care provider tells you otherwise. Relieving pain and discomfort  Take frequent breaks and rest with your legs elevated if you have leg cramps or low back pain.  Take warm sitz baths to soothe any pain or discomfort caused by hemorrhoids. Use hemorrhoid cream if your health care provider approves.  Wear a good support bra to prevent discomfort from breast tenderness.  If you develop varicose veins: ? Wear support pantyhose or compression stockings as told by your healthcare provider. ? Elevate your feet for 15 minutes, 3-4 times a day. Prenatal care  Write down your questions. Take them to your prenatal visits.  Keep all your prenatal visits as told by your health care provider. This is important. Safety  Wear your seat belt at  all times when driving.  Make a list of emergency phone numbers, including numbers for family, friends, the hospital, and police and fire departments. General instructions  Avoid cat litter boxes and soil used by cats. These carry germs that can cause birth defects in the baby. If you have a cat, ask someone to clean the litter box for you.  Do not travel far distances unless it is absolutely necessary and only with the approval of your health care provider.  Do not use hot tubs, steam rooms, or saunas.  Do not drink alcohol.  Do not use any products that contain nicotine or tobacco, such as cigarettes and e-cigarettes. If you need help quitting, ask your health care provider.  Do not use any medicinal herbs or unprescribed drugs. These chemicals affect the formation and growth of the baby.  Do not douche or use tampons or scented sanitary pads.  Do not cross your legs for long periods of time.  To prepare for the arrival of your baby: ? Take prenatal classes to understand, practice, and ask questions about labor and delivery. ? Make a trial run to the hospital. ? Visit the hospital and tour the maternity area. ? Arrange for maternity or paternity leave through employers. ? Arrange for family and friends to take care of pets while you are in the hospital. ? Purchase a rear-facing car seat and make sure you know how to install it in your car. ? Pack your hospital bag. ? Prepare the baby's nursery. Make sure to remove all pillows and stuffed animals from the baby's crib to prevent suffocation.  Visit your dentist if you have not gone during your pregnancy. Use a soft toothbrush to brush your teeth and be gentle when you floss. Contact a health care provider if:  You are unsure if you are in labor or if your water has broken.  You become dizzy.  You have mild pelvic cramps, pelvic pressure, or nagging pain in your abdominal area.  You have lower back pain.  You have persistent  nausea, vomiting, or diarrhea.  You have an unusual or bad smelling vaginal discharge.  You have pain when you urinate. Get help right away if:  Your water breaks before 37 weeks.  You have regular contractions less than 5 minutes apart before 37 weeks.  You have a fever.  You are leaking fluid from your vagina.  You have spotting or bleeding from your vagina.  You have severe abdominal pain or cramping.  You have rapid weight loss or weight gain.    You have shortness of breath with chest pain.  You notice sudden or extreme swelling of your face, hands, ankles, feet, or legs.  Your baby makes fewer than 10 movements in 2 hours.  You have severe headaches that do not go away when you take medicine.  You have vision changes. Summary  The third trimester is from week 28 through week 40, months 7 through 9. The third trimester is a time when the unborn baby (fetus) is growing rapidly.  During the third trimester, your discomfort may increase as you and your baby continue to gain weight. You may have abdominal, leg, and back pain, sleeping problems, and an increased need to urinate.  During the third trimester your breasts will keep growing and they will continue to become tender. A yellow fluid (colostrum) may leak from your breasts. This is the first milk you are producing for your baby.  False labor is a condition in which you feel small, irregular tightenings of the muscles in the womb (contractions) that eventually go away. These are called Braxton Hicks contractions. Contractions may last for hours, days, or even weeks before true labor sets in.  Signs of labor can include: abdominal cramps; regular contractions that start at 10 minutes apart and become stronger and more frequent with time; watery or bloody mucus discharge that comes from the vagina; increased pelvic pressure and dull back pain; and leaking of amniotic fluid. This information is not intended to replace advice  given to you by your health care provider. Make sure you discuss any questions you have with your health care provider. Document Released: 03/28/2001 Document Revised: 09/09/2015 Document Reviewed: 06/04/2012 Elsevier Interactive Patient Education  2017 [employer]  

## 2017-11-29 NOTE — Progress Notes (Signed)
Subjective:  Kristy Davis is a 27 y.o. G1P0 at 4644w1d being seen today for ongoing prenatal care.  She is currently monitored for the following issues for this low-risk pregnancy and has Migraine; Supervision of normal pregnancy, antepartum; Constipation during pregnancy in second trimester; and Gas pain on their problem list.  Patient reports no complaints.  Contractions: Not present. Vag. Bleeding: None.  Movement: Present. Denies leaking of fluid.   The following portions of the patient's history were reviewed and updated as appropriate: allergies, current medications, past family history, past medical history, past social history, past surgical history and problem list. Problem list updated.  Objective:   Vitals:   11/29/17 0837  BP: 118/73  Pulse: (!) 108  Weight: 175 lb 8 oz (79.6 kg)    Fetal Status: Fetal Heart Rate (bpm): 140   Movement: Present     General:  Alert, oriented and cooperative. Patient is in no acute distress.  Skin: Skin is warm and dry. No rash noted.   Cardiovascular: Normal heart rate noted  Respiratory: Normal respiratory effort, no problems with respiration noted  Abdomen: Soft, gravid, appropriate for gestational age. Pain/Pressure: Absent     Pelvic:  Cervical exam deferred        Extremities: Normal range of motion.  Edema: Trace  Mental Status: Normal mood and affect. Normal behavior. Normal judgment and thought content.   Urinalysis:      Assessment and Plan:  Pregnancy: G1P0 at 4244w1d  1. Supervision of normal first pregnancy, antepartum Stable  2. H/O HSV Suppression Tx at 36 weeks  Preterm labor symptoms and general obstetric precautions including but not limited to vaginal bleeding, contractions, leaking of fluid and fetal movement were reviewed in detail with the patient. Please refer to After Visit Summary for other counseling recommendations.  Return in about 2 weeks (around 12/13/2017) for OB visit.   Kristy Davis, Kristy Seda L, MD

## 2017-11-29 NOTE — Progress Notes (Signed)
Patient reports fetal movement, denies pain. 

## 2017-12-13 ENCOUNTER — Ambulatory Visit (INDEPENDENT_AMBULATORY_CARE_PROVIDER_SITE_OTHER): Payer: Medicaid Other | Admitting: Obstetrics & Gynecology

## 2017-12-13 ENCOUNTER — Other Ambulatory Visit: Payer: Self-pay

## 2017-12-13 DIAGNOSIS — Z3490 Encounter for supervision of normal pregnancy, unspecified, unspecified trimester: Secondary | ICD-10-CM

## 2017-12-13 NOTE — Progress Notes (Signed)
ROB.  Declined FLU Vaccine.

## 2017-12-13 NOTE — Patient Instructions (Signed)

## 2017-12-13 NOTE — Progress Notes (Signed)
   PRENATAL VISIT NOTE  Subjective:  Kristy Davis is a 27 y.o. G1P0 at 5211w1d being seen today for ongoing prenatal care.  She is currently monitored for the following issues for this low-risk pregnancy and has Migraine; Supervision of normal pregnancy, antepartum; Constipation during pregnancy in second trimester; and Gas pain on their problem list.  Patient reports occasional contractions.  Contractions: Not present. Vag. Bleeding: None.  Movement: Present. Denies leaking of fluid.   The following portions of the patient's history were reviewed and updated as appropriate: allergies, current medications, past family history, past medical history, past social history, past surgical history and problem list. Problem list updated.  Objective:   Vitals:   12/13/17 0949  BP: 107/64  Pulse: (!) 102  Weight: 182 lb 9.6 oz (82.8 kg)    Fetal Status: Fetal Heart Rate (bpm): 136 Fundal Height: 32 cm Movement: Present     General:  Alert, oriented and cooperative. Patient is in no acute distress.  Skin: Skin is warm and dry. No rash noted.   Cardiovascular: Normal heart rate noted  Respiratory: Normal respiratory effort, no problems with respiration noted  Abdomen: Soft, gravid, appropriate for gestational age.  Pain/Pressure: Present     Pelvic: Cervical exam deferred        Extremities: Normal range of motion.  Edema: Trace  Mental Status: Normal mood and affect. Normal behavior. Normal judgment and thought content.   Assessment and Plan:  Pregnancy: G1P0 at 6311w1d  1. Encounter for supervision of normal pregnancy, antepartum, unspecified [facility]  Preterm labor symptoms and general obstetric precautions including but not limited to vaginal bleeding, contractions, leaking of fluid and fetal movement were reviewed in detail with the patient. Please refer to After Visit Summary for other counseling recommendations.  Return in about 2 weeks (around 12/27/2017).  No  future appointments.  Scheryl DarterJames Arnold, MD

## 2017-12-26 ENCOUNTER — Ambulatory Visit (INDEPENDENT_AMBULATORY_CARE_PROVIDER_SITE_OTHER): Payer: Medicaid Other | Admitting: Obstetrics & Gynecology

## 2017-12-26 VITALS — BP 126/75 | HR 98 | Wt 182.2 lb

## 2017-12-26 DIAGNOSIS — Z3490 Encounter for supervision of normal pregnancy, unspecified, unspecified trimester: Secondary | ICD-10-CM

## 2017-12-26 DIAGNOSIS — O98313 Other infections with a predominantly sexual mode of transmission complicating pregnancy, third trimester: Secondary | ICD-10-CM

## 2017-12-26 DIAGNOSIS — O98319 Other infections with a predominantly sexual mode of transmission complicating pregnancy, unspecified trimester: Secondary | ICD-10-CM

## 2017-12-26 DIAGNOSIS — A6009 Herpesviral infection of other urogenital tract: Secondary | ICD-10-CM

## 2017-12-26 NOTE — Patient Instructions (Signed)
Back Pain in Pregnancy Back pain during pregnancy is common. Back pain may be caused by several factors that are related to changes during your pregnancy. Follow these instructions at home: Managing pain, stiffness, and swelling  If directed, apply ice for sudden (acute) back pain. ? Put ice in a plastic bag. ? Place a towel between your skin and the bag. ? Leave the ice on for 20 minutes, 2-3 times per day.  If directed, apply heat to the affected area before you exercise: ? Place a towel between your skin and the heat pack or heating pad. ? Leave the heat on for 20-30 minutes. ? Remove the heat if your skin turns bright red. This is especially important if you are unable to feel pain, heat, or cold. You may have a greater risk of getting burned. Activity  Exercise as told by your health care provider. Exercising is the best way to prevent or manage back pain.  Listen to your body when lifting. If lifting hurts, ask for help or bend your knees. This uses your leg muscles instead of your back muscles.  Squat down when picking up something from the floor. Do not bend over.  Only use bed rest as told by your health care provider. Bed rest should only be used for the most severe episodes of back pain. Standing, Sitting, and Lying Down  Do not stand in one place for long periods of time.  Use good posture when sitting. Make sure your head rests over your shoulders and is not hanging forward. Use a pillow on your lower back if necessary.  Try sleeping on your side, preferably the left side, with a pillow or two between your legs. If you are sore after a night's rest, your bed may be too soft. A firm mattress may provide more support for your back during pregnancy. General instructions  Do not wear high heels.  Eat a healthy diet. Try to gain weight within your health care provider's recommendations.  Use a maternity girdle, elastic sling, or back brace as told by your health care  provider.  Take over-the-counter and prescription medicines only as told by your health care provider.  Keep all follow-up visits as told by your health care provider. This is important. This includes any visits with any specialists, such as a physical therapist. Contact a health care provider if:  Your back pain interferes with your daily activities.  You have increasing pain in other parts of your body. Get help right away if:  You develop numbness, tingling, weakness, or problems with the use of your arms or legs.  You develop severe back pain that is not controlled with medicine.  You have a sudden change in bowel or bladder control.  You develop shortness of breath, dizziness, or you faint.  You develop nausea, vomiting, or sweating.  You have back pain that is a rhythmic, cramping pain similar to labor pains. Labor pain is usually 1-2 minutes apart, lasts for about 1 minute, and involves a bearing down feeling or pressure in your pelvis.  You have back pain and your water breaks or you have vaginal bleeding.  You have back pain or numbness that travels down your leg.  Your back pain developed after you fell.  You develop pain on one side of your back.  You see blood in your urine.  You develop skin blisters in the area of your back pain. This information is not intended to replace advice given to you   by your health care provider. Make sure you discuss any questions you have with your health care provider. Document Released: 07/12/2005 Document Revised: 09/09/2015 Document Reviewed: 12/16/2014 Elsevier Interactive Patient Education  2018 [employer]  

## 2017-12-26 NOTE — Progress Notes (Signed)
Patient reports good fetal movement with occasional contractions. Reports left sided pain.

## 2017-12-26 NOTE — Progress Notes (Signed)
   PRENATAL VISIT NOTE  Subjective:  Kristy Davis is a 27 y.o. G1P0 at [redacted]w[redacted]d being seen today for ongoing prenatal care.  She is currently monitored for the following issues for this low-risk pregnancy and has Migraine; Supervision of normal pregnancy, antepartum; Constipation during pregnancy in second trimester; Gas pain; and Genital herpes affecting pregnancy on their problem list.  Patient reports pain in groin like "charlie horse".  Contractions: Irregular. Vag. Bleeding: None.  Movement: Present. Denies leaking of fluid.   The following portions of the patient's history were reviewed and updated as appropriate: allergies, current medications, past family history, past medical history, past social history, past surgical history and problem list. Problem list updated.  Objective:   Vitals:   12/26/17 1612  BP: 126/75  Pulse: 98  Weight: 182 lb 3.2 oz (82.6 kg)    Fetal Status: Fetal Heart Rate (bpm): 145 Fundal Height: 34 cm Movement: Present     General:  Alert, oriented and cooperative. Patient is in no acute distress.  Skin: Skin is warm and dry. No rash noted.   Cardiovascular: Normal heart rate noted  Respiratory: Normal respiratory effort, no problems with respiration noted  Abdomen: Soft, gravid, appropriate for gestational age.  Pain/Pressure: Present     Pelvic: Cervical exam deferred        Extremities: Normal range of motion.  Edema: Trace, no evidence of DVT  Mental Status: Normal mood and affect. Normal behavior. Normal judgment and thought content.   Assessment and Plan:  Pregnancy: G1P0 at [redacted]w[redacted]d  1. Encounter for supervision of normal pregnancy, antepartum, unspecified gravidity    Preterm labor symptoms and general obstetric precautions including but not limited to vaginal bleeding, contractions, leaking of fluid and fetal movement were reviewed in detail with the patient. Please refer to After Visit Summary for other counseling recommendations.  Return  in about 2 weeks (around 01/09/2018).  Future Appointments  Date Time Provider Department Center  01/09/2018  9:15 AM Adam Phenix, MD [facility] None    Scheryl Darter, MD

## 2018-01-09 ENCOUNTER — Other Ambulatory Visit (HOSPITAL_COMMUNITY)
Admission: RE | Admit: 2018-01-09 | Discharge: 2018-01-09 | Disposition: A | Discharge status: Home / Self Care | Payer: Medicaid Other | Source: Ambulatory Visit | Attending: Obstetrics & Gynecology | Admitting: Obstetrics & Gynecology

## 2018-01-09 ENCOUNTER — Ambulatory Visit (INDEPENDENT_AMBULATORY_CARE_PROVIDER_SITE_OTHER): Payer: Medicaid Other | Admitting: Obstetrics & Gynecology

## 2018-01-09 ENCOUNTER — Telehealth: Payer: Self-pay

## 2018-01-09 VITALS — BP 113/70 | HR 109 | Wt 182.8 lb

## 2018-01-09 DIAGNOSIS — Z3493 Encounter for supervision of normal pregnancy, unspecified, third trimester: Secondary | ICD-10-CM | POA: Diagnosis present

## 2018-01-09 DIAGNOSIS — A6009 Herpesviral infection of other urogenital tract: Secondary | ICD-10-CM

## 2018-01-09 DIAGNOSIS — O98313 Other infections with a predominantly sexual mode of transmission complicating pregnancy, third trimester: Secondary | ICD-10-CM

## 2018-01-09 DIAGNOSIS — Z3A36 36 weeks gestation of pregnancy: Secondary | ICD-10-CM | POA: Insufficient documentation

## 2018-01-09 DIAGNOSIS — Z3490 Encounter for supervision of normal pregnancy, unspecified, unspecified trimester: Secondary | ICD-10-CM

## 2018-01-09 LAB — OB RESULTS CONSOLE GC/CHLAMYDIA: GC PROBE AMP, GENITAL: NEGATIVE

## 2018-01-09 MED ORDER — VALACYCLOVIR HCL 1 G PO TABS: 1000.0000 mg | ORAL_TABLET | Freq: Every day | ORAL | 1 refills | Status: DC

## 2018-01-09 NOTE — Progress Notes (Signed)
Pt presents for ROB.  No concerns! 

## 2018-01-09 NOTE — Progress Notes (Signed)
   PRENATAL VISIT NOTE  Subjective:  Kristy Davis is a 27 y.o. G1P0 at [redacted]w[redacted]d being seen today for ongoing prenatal care.  She is currently monitored for the following issues for this low-risk pregnancy and has Migraine; Supervision of normal pregnancy, antepartum; Constipation during pregnancy in second trimester; Gas pain; and Genital herpes affecting pregnancy on their problem list.  Patient reports no complaints.  Contractions: Irregular. Vag. Bleeding: None.  Movement: Present. Denies leaking of fluid.   The following portions of the patient's history were reviewed and updated as appropriate: allergies, current medications, past family history, past medical history, past social history, past surgical history and problem list. Problem list updated.  Objective:   Vitals:   01/09/18 0925  BP: 113/70  Pulse: (!) 109  Weight: 182 lb 12.8 oz (82.9 kg)    Fetal Status: Fetal Heart Rate (bpm): 145   Movement: Present     General:  Alert, oriented and cooperative. Patient is in no acute distress.  Skin: Skin is warm and dry. No rash noted.   Cardiovascular: Normal heart rate noted  Respiratory: Normal respiratory effort, no problems with respiration noted  Abdomen: Soft, gravid, appropriate for gestational age.  Pain/Pressure: Absent     Pelvic: Cervical exam performed        Extremities: Normal range of motion.  Edema: Trace  Mental Status: Normal mood and affect. Normal behavior. Normal judgment and thought content.   Assessment and Plan:  Pregnancy: G1P0 at [redacted]w[redacted]d  1. Encounter for supervision of normal pregnancy, antepartum, unspecified gravidity Routine labs - Strep Gp B NAA - Cervicovaginal ancillary only  2. Genital herpes affecting pregnancy in third trimester suppression therapy Preterm labor symptoms and general obstetric precautions including but not limited to vaginal bleeding, contractions, leaking of fluid and fetal movement were reviewed in detail with the  patient. Please refer to After Visit Summary for other counseling recommendations.  Return in about 1 week (around 01/16/2018).  No future appointments.  Scheryl Darter, MD

## 2018-01-09 NOTE — Telephone Encounter (Signed)
Received message from pt c/o VB and outer vulva burning after appt today. GC/CT/GBS completed today. She said provider checked her cervix without lube and it was painful. Informed pt this is normal after VE. Informed pt to continue to monitor sx's if bleeding gets heavier, LOF, cramping, DEC FM, or if burning does not subside by tomorrow, contact the office. Pt agrees and voices understanding.

## 2018-01-09 NOTE — Patient Instructions (Signed)
Vaginal Delivery Vaginal delivery means that you will give birth by pushing your baby out of your birth canal (vagina). A team of health care providers will help you before, during, and after vaginal delivery. Birth experiences are unique for every woman and every pregnancy, and birth experiences vary depending on where you choose to give birth. What should I do to prepare for my baby's birth? Before your baby is born, it is important to talk with your health care provider about:  Your labor and delivery preferences. These may include: ? Medicines that you may be given. ? How you will manage your pain. This might include non-medical pain relief techniques or injectable pain relief such as epidural analgesia. ? How you and your baby will be monitored during labor and delivery. ? Who may be in the labor and delivery room with you. ? Your feelings about surgical delivery of your baby (cesarean delivery, or C-section) if this becomes necessary. ? Your feelings about receiving donated blood through an IV tube (blood transfusion) if this becomes necessary.  Whether you are able: ? To take pictures or videos of the birth. ? To eat during labor and delivery. ? To move around, walk, or change positions during labor and delivery.  What to expect after your baby is born, such as: ? Whether delayed umbilical cord clamping and cutting is offered. ? Who will care for your baby right after birth. ? Medicines or tests that may be recommended for your baby. ? Whether breastfeeding is supported in your hospital or birth center. ? How long you will be in the hospital or birth center.  How any medical conditions you have may affect your baby or your labor and delivery experience.  To prepare for your baby's birth, you should also:  Attend all of your health care visits before delivery (prenatal visits) as recommended by your health care provider. This is important.  Prepare your home for your baby's  arrival. Make sure that you have: ? Diapers. ? Baby clothing. ? Feeding equipment. ? Safe sleeping arrangements for you and your baby.  Install a car seat in your vehicle. Have your car seat checked by a certified car seat installer to make sure that it is installed safely.  Think about who will help you with your new baby at home for at least the first several weeks after delivery.  What can I expect when I arrive at the birth center or hospital? Once you are in labor and have been admitted into the hospital or birth center, your health care provider may:  Review your pregnancy history and any concerns you have.  Insert an IV tube into one of your veins. This is used to give you fluids and medicines.  Check your blood pressure, pulse, temperature, and heart rate (vital signs).  Check whether your bag of water (amniotic sac) has broken (ruptured).  Talk with you about your birth plan and discuss pain control options.  Monitoring Your health care provider may monitor your contractions (uterine monitoring) and your baby's heart rate (fetal monitoring). You may need to be monitored:  Often, but not continuously (intermittently).  All the time or for long periods at a time (continuously). Continuous monitoring may be needed if: ? You are taking certain medicines, such as medicine to relieve pain or make your contractions stronger. ? You have pregnancy or labor complications.  Monitoring may be done by:  Placing a special stethoscope or a handheld monitoring device on your abdomen to   check your baby's heartbeat, and feeling your abdomen for contractions. This method of monitoring does not continuously record your baby's heartbeat or your contractions.  Placing monitors on your abdomen (external monitors) to record your baby's heartbeat and the frequency and length of contractions. You may not have to wear external monitors all the time.  Placing monitors inside of your uterus  (internal monitors) to record your baby's heartbeat and the frequency, length, and strength of your contractions. ? Your health care provider may use internal monitors if he or she needs more information about the strength of your contractions or your baby's heart rate. ? Internal monitors are put in place by passing a thin, flexible wire through your vagina and into your uterus. Depending on the type of monitor, it may remain in your uterus or on your baby's head until birth. ? Your health care provider will discuss the benefits and risks of internal monitoring with you and will ask for your permission before inserting the monitors.  Telemetry. This is a type of continuous monitoring that can be done with external or internal monitors. Instead of having to stay in bed, you are able to move around during telemetry. Ask your health care provider if telemetry is an option for you.  Physical exam Your health care provider may perform a physical exam. This may include:  Checking whether your baby is positioned: ? With the head toward your vagina (head-down). This is most common. ? With the head toward the top of your uterus (head-up or breech). If your baby is in a breech position, your health care provider may try to turn your baby to a head-down position so you can deliver vaginally. If it does not seem that your baby can be born vaginally, your provider may recommend surgery to deliver your baby. In rare cases, you may be able to deliver vaginally if your baby is head-up (breech delivery). ? Lying sideways (transverse). Babies that are lying sideways cannot be delivered vaginally.  Checking your cervix to determine: ? Whether it is thinning out (effacing). ? Whether it is opening up (dilating). ? How low your baby has moved into your birth canal.  What are the three stages of labor and delivery?  Normal labor and delivery is divided into the following three stages: Stage 1  Stage 1 is the  longest stage of labor, and it can last for hours or days. Stage 1 includes: ? Early labor. This is when contractions may be irregular, or regular and mild. Generally, early labor contractions are more than 10 minutes apart. ? Active labor. This is when contractions get longer, more regular, more frequent, and more intense. ? The transition phase. This is when contractions happen very close together, are very intense, and may last longer than during any other part of labor.  Contractions generally feel mild, infrequent, and irregular at first. They get stronger, more frequent (about every 2-3 minutes), and more regular as you progress from early labor through active labor and transition.  Many women progress through stage 1 naturally, but you may need help to continue making progress. If this happens, your health care provider may talk with you about: ? Rupturing your amniotic sac if it has not ruptured yet. ? Giving you medicine to help make your contractions stronger and more frequent.  Stage 1 ends when your cervix is completely dilated to 4 inches (10 cm) and completely effaced. This happens at the end of the transition phase. Stage 2  Once   your cervix is completely effaced and dilated to 4 inches (10 cm), you may start to feel an urge to push. It is common for the body to naturally take a rest before feeling the urge to push, especially if you received an epidural or certain other pain medicines. This rest period may last for up to 1-2 hours, depending on your unique labor experience.  During stage 2, contractions are generally less painful, because pushing helps relieve contraction pain. Instead of contraction pain, you may feel stretching and burning pain, especially when the widest part of your baby's head passes through the vaginal opening (crowning).  Your health care provider will closely monitor your pushing progress and your baby's progress through the vagina during stage 2.  Your  health care provider may massage the area of skin between your vaginal opening and anus (perineum) or apply warm compresses to your perineum. This helps it stretch as the baby's head starts to crown, which can help prevent perineal tearing. ? In some cases, an incision may be made in your perineum (episiotomy) to allow the baby to pass through the vaginal opening. An episiotomy helps to make the opening of the vagina larger to allow more room for the baby to fit through.  It is very important to breathe and focus so your health care provider can control the delivery of your baby's head. Your health care provider may have you decrease the intensity of your pushing, to help prevent perineal tearing.  After delivery of your baby's head, the shoulders and the rest of the body generally deliver very quickly and without difficulty.  Once your baby is delivered, the umbilical cord may be cut right away, or this may be delayed for 1-2 minutes, depending on your baby's health. This may vary among health care providers, hospitals, and birth centers.  If you and your baby are healthy enough, your baby may be placed on your chest or abdomen to help maintain the baby's temperature and to help you bond with each other. Some mothers and babies start breastfeeding at this time. Your health care team will dry your baby and help keep your baby warm during this time.  Your baby may need immediate care if he or she: ? Showed signs of distress during labor. ? Has a medical condition. ? Was born too early (prematurely). ? Had a bowel movement before birth (meconium). ? Shows signs of difficulty transitioning from being inside the uterus to being outside of the uterus. If you are planning to breastfeed, your health care team will help you begin a feeding. Stage 3  The third stage of labor starts immediately after the birth of your baby and ends after you deliver the placenta. The placenta is an organ that develops  during pregnancy to provide oxygen and nutrients to your baby in the womb.  Delivering the placenta may require some pushing, and you may have mild contractions. Breastfeeding can stimulate contractions to help you deliver the placenta.  After the placenta is delivered, your uterus should tighten (contract) and become firm. This helps to stop bleeding in your uterus. To help your uterus contract and to control bleeding, your health care provider may: ? Give you medicine by injection, through an IV tube, by mouth, or through your rectum (rectally). ? Massage your abdomen or perform a vaginal exam to remove any blood clots that are left in your uterus. ? Empty your bladder by placing a thin, flexible tube (catheter) into your bladder. ? Encourage   you to breastfeed your baby. After labor is over, you and your baby will be monitored closely to ensure that you are both healthy until you are ready to go home. Your health care team will teach you how to care for yourself and your baby. This information is not intended to replace advice given to you by your health care provider. Make sure you discuss any questions you have with your health care provider. Document Released: 01/11/2008 Document Revised: 10/22/2015 Document Reviewed: 04/18/2015 Elsevier Interactive Patient Education  2018 [employer]  

## 2018-01-10 LAB — CERVICOVAGINAL ANCILLARY ONLY
Chlamydia: NEGATIVE
Neisseria Gonorrhea: NEGATIVE

## 2018-01-11 LAB — STREP GP B NAA: STREP GROUP B AG: NEGATIVE

## 2018-01-15 ENCOUNTER — Encounter: Payer: Self-pay | Admitting: Obstetrics & Gynecology

## 2018-01-15 ENCOUNTER — Ambulatory Visit (INDEPENDENT_AMBULATORY_CARE_PROVIDER_SITE_OTHER): Payer: Medicaid Other | Admitting: Obstetrics & Gynecology

## 2018-01-15 VITALS — BP 108/67 | HR 99 | Wt 187.2 lb

## 2018-01-15 DIAGNOSIS — Z3490 Encounter for supervision of normal pregnancy, unspecified, unspecified trimester: Secondary | ICD-10-CM

## 2018-01-15 DIAGNOSIS — A6009 Herpesviral infection of other urogenital tract: Secondary | ICD-10-CM

## 2018-01-15 DIAGNOSIS — G43009 Migraine without aura, not intractable, without status migrainosus: Secondary | ICD-10-CM

## 2018-01-15 DIAGNOSIS — O98313 Other infections with a predominantly sexual mode of transmission complicating pregnancy, third trimester: Secondary | ICD-10-CM

## 2018-01-15 NOTE — Progress Notes (Signed)
   PRENATAL VISIT NOTE  Subjective:  Kristy Davis is a 27 y.o. G1P0 at [redacted]w[redacted]d being seen today for ongoing prenatal care.  She is currently monitored for the following issues for this low-risk pregnancy and has Migraine; Supervision of normal pregnancy, antepartum; Constipation during pregnancy in second trimester; Gas pain; and Genital herpes affecting pregnancy on their problem list.  Patient reports no complaints.  Contractions: Not present. Vag. Bleeding: None.  Movement: Present. Denies leaking of fluid.   The following portions of the patient's history were reviewed and updated as appropriate: allergies, current medications, past family history, past medical history, past social history, past surgical history and problem list. Problem list updated.  Objective:   Vitals:   01/15/18 1606  BP: 108/67  Pulse: 99  Weight: 187 lb 3.2 oz (84.9 kg)    Fetal Status:     Movement: Present     General:  Alert, oriented and cooperative. Patient is in no acute distress.  Skin: Skin is warm and dry. No rash noted.   Cardiovascular: Normal heart rate noted  Respiratory: Normal respiratory effort, no problems with respiration noted  Abdomen: Soft, gravid, appropriate for gestational age.  Pain/Pressure: Absent     Pelvic: Cervical exam deferred        Extremities: Normal range of motion.  Edema: Trace  Mental Status: Normal mood and affect. Normal behavior. Normal judgment and thought content.   Assessment and Plan:  Pregnancy: G1P0 at [redacted]w[redacted]d  1. Encounter for supervision of normal pregnancy, antepartum, unspecified gravidity  2. Migraine without aura and without status migrainosus, not intractable  3. Genital herpes affecting pregnancy in third trimester Valtrex suppression started last visit.    Preterm labor symptoms and general obstetric precautions including but not limited to vaginal bleeding, contractions, leaking of fluid and fetal movement were reviewed in detail with the  patient. Please refer to After Visit Summary for other counseling recommendations.  Return in about 1 week (around 01/22/2018).  Future Appointments  Date Time Provider Department Center  01/23/2018  4:15 PM Hermina Staggers, MD [facility] None    Willodean Rosenthal, MD

## 2018-01-23 ENCOUNTER — Ambulatory Visit (INDEPENDENT_AMBULATORY_CARE_PROVIDER_SITE_OTHER): Payer: Medicaid Other | Admitting: Obstetrics and Gynecology

## 2018-01-23 ENCOUNTER — Encounter: Payer: Self-pay | Admitting: Obstetrics and Gynecology

## 2018-01-23 VITALS — BP 123/74 | HR 99 | Wt 186.0 lb

## 2018-01-23 DIAGNOSIS — O98313 Other infections with a predominantly sexual mode of transmission complicating pregnancy, third trimester: Secondary | ICD-10-CM

## 2018-01-23 DIAGNOSIS — Z3490 Encounter for supervision of normal pregnancy, unspecified, unspecified trimester: Secondary | ICD-10-CM

## 2018-01-23 DIAGNOSIS — Z3493 Encounter for supervision of normal pregnancy, unspecified, third trimester: Secondary | ICD-10-CM

## 2018-01-23 DIAGNOSIS — A6009 Herpesviral infection of other urogenital tract: Secondary | ICD-10-CM

## 2018-01-23 NOTE — Patient Instructions (Signed)
Vaginal Delivery Vaginal delivery means that you will give birth by pushing your baby out of your birth canal (vagina). A team of health care providers will help you before, during, and after vaginal delivery. Birth experiences are unique for every woman and every pregnancy, and birth experiences vary depending on where you choose to give birth. What should I do to prepare for my baby's birth? Before your baby is born, it is important to talk with your health care provider about:  Your labor and delivery preferences. These may include: ? Medicines that you may be given. ? How you will manage your pain. This might include non-medical pain relief techniques or injectable pain relief such as epidural analgesia. ? How you and your baby will be monitored during labor and delivery. ? Who may be in the labor and delivery room with you. ? Your feelings about surgical delivery of your baby (cesarean delivery, or C-section) if this becomes necessary. ? Your feelings about receiving donated blood through an IV tube (blood transfusion) if this becomes necessary.  Whether you are able: ? To take pictures or videos of the birth. ? To eat during labor and delivery. ? To move around, walk, or change positions during labor and delivery.  What to expect after your baby is born, such as: ? Whether delayed umbilical cord clamping and cutting is offered. ? Who will care for your baby right after birth. ? Medicines or tests that may be recommended for your baby. ? Whether breastfeeding is supported in your hospital or birth center. ? How long you will be in the hospital or birth center.  How any medical conditions you have may affect your baby or your labor and delivery experience.  To prepare for your baby's birth, you should also:  Attend all of your health care visits before delivery (prenatal visits) as recommended by your health care provider. This is important.  Prepare your home for your baby's  arrival. Make sure that you have: ? Diapers. ? Baby clothing. ? Feeding equipment. ? Safe sleeping arrangements for you and your baby.  Install a car seat in your vehicle. Have your car seat checked by a certified car seat installer to make sure that it is installed safely.  Think about who will help you with your new baby at home for at least the first several weeks after delivery.  What can I expect when I arrive at the birth center or hospital? Once you are in labor and have been admitted into the hospital or birth center, your health care provider may:  Review your pregnancy history and any concerns you have.  Insert an IV tube into one of your veins. This is used to give you fluids and medicines.  Check your blood pressure, pulse, temperature, and heart rate (vital signs).  Check whether your bag of water (amniotic sac) has broken (ruptured).  Talk with you about your birth plan and discuss pain control options.  Monitoring Your health care provider may monitor your contractions (uterine monitoring) and your baby's heart rate (fetal monitoring). You may need to be monitored:  Often, but not continuously (intermittently).  All the time or for long periods at a time (continuously). Continuous monitoring may be needed if: ? You are taking certain medicines, such as medicine to relieve pain or make your contractions stronger. ? You have pregnancy or labor complications.  Monitoring may be done by:  Placing a special stethoscope or a handheld monitoring device on your abdomen to   check your baby's heartbeat, and feeling your abdomen for contractions. This method of monitoring does not continuously record your baby's heartbeat or your contractions.  Placing monitors on your abdomen (external monitors) to record your baby's heartbeat and the frequency and length of contractions. You may not have to wear external monitors all the time.  Placing monitors inside of your uterus  (internal monitors) to record your baby's heartbeat and the frequency, length, and strength of your contractions. ? Your health care provider may use internal monitors if he or she needs more information about the strength of your contractions or your baby's heart rate. ? Internal monitors are put in place by passing a thin, flexible wire through your vagina and into your uterus. Depending on the type of monitor, it may remain in your uterus or on your baby's head until birth. ? Your health care provider will discuss the benefits and risks of internal monitoring with you and will ask for your permission before inserting the monitors.  Telemetry. This is a type of continuous monitoring that can be done with external or internal monitors. Instead of having to stay in bed, you are able to move around during telemetry. Ask your health care provider if telemetry is an option for you.  Physical exam Your health care provider may perform a physical exam. This may include:  Checking whether your baby is positioned: ? With the head toward your vagina (head-down). This is most common. ? With the head toward the top of your uterus (head-up or breech). If your baby is in a breech position, your health care provider may try to turn your baby to a head-down position so you can deliver vaginally. If it does not seem that your baby can be born vaginally, your provider may recommend surgery to deliver your baby. In rare cases, you may be able to deliver vaginally if your baby is head-up (breech delivery). ? Lying sideways (transverse). Babies that are lying sideways cannot be delivered vaginally.  Checking your cervix to determine: ? Whether it is thinning out (effacing). ? Whether it is opening up (dilating). ? How low your baby has moved into your birth canal.  What are the three stages of labor and delivery?  Normal labor and delivery is divided into the following three stages: Stage 1  Stage 1 is the  longest stage of labor, and it can last for hours or days. Stage 1 includes: ? Early labor. This is when contractions may be irregular, or regular and mild. Generally, early labor contractions are more than 10 minutes apart. ? Active labor. This is when contractions get longer, more regular, more frequent, and more intense. ? The transition phase. This is when contractions happen very close together, are very intense, and may last longer than during any other part of labor.  Contractions generally feel mild, infrequent, and irregular at first. They get stronger, more frequent (about every 2-3 minutes), and more regular as you progress from early labor through active labor and transition.  Many women progress through stage 1 naturally, but you may need help to continue making progress. If this happens, your health care provider may talk with you about: ? Rupturing your amniotic sac if it has not ruptured yet. ? Giving you medicine to help make your contractions stronger and more frequent.  Stage 1 ends when your cervix is completely dilated to 4 inches (10 cm) and completely effaced. This happens at the end of the transition phase. Stage 2  Once   your cervix is completely effaced and dilated to 4 inches (10 cm), you may start to feel an urge to push. It is common for the body to naturally take a rest before feeling the urge to push, especially if you received an epidural or certain other pain medicines. This rest period may last for up to 1-2 hours, depending on your unique labor experience.  During stage 2, contractions are generally less painful, because pushing helps relieve contraction pain. Instead of contraction pain, you may feel stretching and burning pain, especially when the widest part of your baby's head passes through the vaginal opening (crowning).  Your health care provider will closely monitor your pushing progress and your baby's progress through the vagina during stage 2.  Your  health care provider may massage the area of skin between your vaginal opening and anus (perineum) or apply warm compresses to your perineum. This helps it stretch as the baby's head starts to crown, which can help prevent perineal tearing. ? In some cases, an incision may be made in your perineum (episiotomy) to allow the baby to pass through the vaginal opening. An episiotomy helps to make the opening of the vagina larger to allow more room for the baby to fit through.  It is very important to breathe and focus so your health care provider can control the delivery of your baby's head. Your health care provider may have you decrease the intensity of your pushing, to help prevent perineal tearing.  After delivery of your baby's head, the shoulders and the rest of the body generally deliver very quickly and without difficulty.  Once your baby is delivered, the umbilical cord may be cut right away, or this may be delayed for 1-2 minutes, depending on your baby's health. This may vary among health care providers, hospitals, and birth centers.  If you and your baby are healthy enough, your baby may be placed on your chest or abdomen to help maintain the baby's temperature and to help you bond with each other. Some mothers and babies start breastfeeding at this time. Your health care team will dry your baby and help keep your baby warm during this time.  Your baby may need immediate care if he or she: ? Showed signs of distress during labor. ? Has a medical condition. ? Was born too early (prematurely). ? Had a bowel movement before birth (meconium). ? Shows signs of difficulty transitioning from being inside the uterus to being outside of the uterus. If you are planning to breastfeed, your health care team will help you begin a feeding. Stage 3  The third stage of labor starts immediately after the birth of your baby and ends after you deliver the placenta. The placenta is an organ that develops  during pregnancy to provide oxygen and nutrients to your baby in the womb.  Delivering the placenta may require some pushing, and you may have mild contractions. Breastfeeding can stimulate contractions to help you deliver the placenta.  After the placenta is delivered, your uterus should tighten (contract) and become firm. This helps to stop bleeding in your uterus. To help your uterus contract and to control bleeding, your health care provider may: ? Give you medicine by injection, through an IV tube, by mouth, or through your rectum (rectally). ? Massage your abdomen or perform a vaginal exam to remove any blood clots that are left in your uterus. ? Empty your bladder by placing a thin, flexible tube (catheter) into your bladder. ? Encourage   you to breastfeed your baby. After labor is over, you and your baby will be monitored closely to ensure that you are both healthy until you are ready to go home. Your health care team will teach you how to care for yourself and your baby. This information is not intended to replace advice given to you by your health care provider. Make sure you discuss any questions you have with your health care provider. Document Released: 01/11/2008 Document Revised: 10/22/2015 Document Reviewed: 04/18/2015 Elsevier Interactive Patient Education  2018 [employer]  

## 2018-01-23 NOTE — Progress Notes (Signed)
Subjective:  Kristy Davis is a 27 y.o. G1P0 at [redacted]w[redacted]d being seen today for ongoing prenatal care.  She is currently monitored for the following issues for this low-risk pregnancy and has Migraine; Supervision of normal pregnancy, antepartum; and Genital herpes affecting pregnancy on their problem list.  Patient reports general discomforts of pregnancy.  Contractions: Not present. Vag. Bleeding: None.  Movement: Present. Denies leaking of fluid.   The following portions of the patient's history were reviewed and updated as appropriate: allergies, current medications, past family history, past medical history, past social history, past surgical history and problem list. Problem list updated.  Objective:   Vitals:   01/23/18 1626  BP: 123/74  Pulse: 99  Weight: 186 lb (84.4 kg)    Fetal Status: Fetal Heart Rate (bpm): 156   Movement: Present     General:  Alert, oriented and cooperative. Patient is in no acute distress.  Skin: Skin is warm and dry. No rash noted.   Cardiovascular: Normal heart rate noted  Respiratory: Normal respiratory effort, no problems with respiration noted  Abdomen: Soft, gravid, appropriate for gestational age. Pain/Pressure: Present     Pelvic:  Cervical exam deferred        Extremities: Normal range of motion.  Edema: Trace  Mental Status: Normal mood and affect. Normal behavior. Normal judgment and thought content.   Urinalysis:      Assessment and Plan:  Pregnancy: G1P0 at [redacted]w[redacted]d  1. Encounter for supervision of normal pregnancy, antepartum, unspecified gravidity Stable Labor precautions  2. Genital herpes affecting pregnancy in third trimester Continue with suppression, Valtrex  Term labor symptoms and general obstetric precautions including but not limited to vaginal bleeding, contractions, leaking of fluid and fetal movement were reviewed in detail with the patient. Please refer to After Visit Summary for other counseling recommendations.   Return in about 1 week (around 01/30/2018) for OB visit.   Hermina Staggers, MD

## 2018-01-30 ENCOUNTER — Encounter: Payer: Self-pay | Admitting: Obstetrics and Gynecology

## 2018-01-30 ENCOUNTER — Ambulatory Visit (INDEPENDENT_AMBULATORY_CARE_PROVIDER_SITE_OTHER): Payer: Medicaid Other | Admitting: Obstetrics and Gynecology

## 2018-01-30 VITALS — BP 118/72 | HR 98 | Wt 187.0 lb

## 2018-01-30 DIAGNOSIS — Z3400 Encounter for supervision of normal first pregnancy, unspecified trimester: Secondary | ICD-10-CM

## 2018-01-30 DIAGNOSIS — O98313 Other infections with a predominantly sexual mode of transmission complicating pregnancy, third trimester: Secondary | ICD-10-CM

## 2018-01-30 DIAGNOSIS — A6009 Herpesviral infection of other urogenital tract: Secondary | ICD-10-CM

## 2018-01-30 NOTE — Progress Notes (Signed)
Subjective:  Kristy Davis is a 27 y.o. G1P0 at [redacted]w[redacted]d being seen today for ongoing prenatal care.  She is currently monitored for the following issues for this low-risk pregnancy and has Migraine; Supervision of normal pregnancy, antepartum; and Genital herpes affecting pregnancy on their problem list.  Patient reports gneral discomforts of pregnancy.  Contractions: Irregular. Vag. Bleeding: None.  Movement: Present. Denies leaking of fluid.   The following portions of the patient's history were reviewed and updated as appropriate: allergies, current medications, past family history, past medical history, past social history, past surgical history and problem list. Problem list updated.  Objective:   Vitals:   01/30/18 1609  BP: 118/72  Pulse: 98  Weight: 187 lb (84.8 kg)    Fetal Status: Fetal Heart Rate (bpm): 135   Movement: Present     General:  Alert, oriented and cooperative. Patient is in no acute distress.  Skin: Skin is warm and dry. No rash noted.   Cardiovascular: Normal heart rate noted  Respiratory: Normal respiratory effort, no problems with respiration noted  Abdomen: Soft, gravid, appropriate for gestational age. Pain/Pressure: Present     Pelvic:  Cervical exam performed        Extremities: Normal range of motion.     Mental Status: Normal mood and affect. Normal behavior. Normal judgment and thought content.   Urinalysis:      Assessment and Plan:  Pregnancy: G1P0 at [redacted]w[redacted]d  1. Supervision of normal first pregnancy, antepartum Labor precautions IOL scheduled at 41 weeks  2. Genital herpes affecting pregnancy in third trimester Continue with suppression  Term labor symptoms and general obstetric precautions including but not limited to vaginal bleeding, contractions, leaking of fluid and fetal movement were reviewed in detail with the patient. Please refer to After Visit Summary for other counseling recommendations.  Return in about 1 week (around  02/06/2018) for OB visit.   Hermina Staggers, MD

## 2018-01-30 NOTE — Patient Instructions (Signed)
Vaginal Delivery Vaginal delivery means that you will give birth by pushing your baby out of your birth canal (vagina). A team of health care providers will help you before, during, and after vaginal delivery. Birth experiences are unique for every woman and every pregnancy, and birth experiences vary depending on where you choose to give birth. What should I do to prepare for my baby's birth? Before your baby is born, it is important to talk with your health care provider about:  Your labor and delivery preferences. These may include: ? Medicines that you may be given. ? How you will manage your pain. This might include non-medical pain relief techniques or injectable pain relief such as epidural analgesia. ? How you and your baby will be monitored during labor and delivery. ? Who may be in the labor and delivery room with you. ? Your feelings about surgical delivery of your baby (cesarean delivery, or C-section) if this becomes necessary. ? Your feelings about receiving donated blood through an IV tube (blood transfusion) if this becomes necessary.  Whether you are able: ? To take pictures or videos of the birth. ? To eat during labor and delivery. ? To move around, walk, or change positions during labor and delivery.  What to expect after your baby is born, such as: ? Whether delayed umbilical cord clamping and cutting is offered. ? Who will care for your baby right after birth. ? Medicines or tests that may be recommended for your baby. ? Whether breastfeeding is supported in your hospital or birth center. ? How long you will be in the hospital or birth center.  How any medical conditions you have may affect your baby or your labor and delivery experience.  To prepare for your baby's birth, you should also:  Attend all of your health care visits before delivery (prenatal visits) as recommended by your health care provider. This is important.  Prepare your home for your baby's  arrival. Make sure that you have: ? Diapers. ? Baby clothing. ? Feeding equipment. ? Safe sleeping arrangements for you and your baby.  Install a car seat in your vehicle. Have your car seat checked by a certified car seat installer to make sure that it is installed safely.  Think about who will help you with your new baby at home for at least the first several weeks after delivery.  What can I expect when I arrive at the birth center or hospital? Once you are in labor and have been admitted into the hospital or birth center, your health care provider may:  Review your pregnancy history and any concerns you have.  Insert an IV tube into one of your veins. This is used to give you fluids and medicines.  Check your blood pressure, pulse, temperature, and heart rate (vital signs).  Check whether your bag of water (amniotic sac) has broken (ruptured).  Talk with you about your birth plan and discuss pain control options.  Monitoring Your health care provider may monitor your contractions (uterine monitoring) and your baby's heart rate (fetal monitoring). You may need to be monitored:  Often, but not continuously (intermittently).  All the time or for long periods at a time (continuously). Continuous monitoring may be needed if: ? You are taking certain medicines, such as medicine to relieve pain or make your contractions stronger. ? You have pregnancy or labor complications.  Monitoring may be done by:  Placing a special stethoscope or a handheld monitoring device on your abdomen to   check your baby's heartbeat, and feeling your abdomen for contractions. This method of monitoring does not continuously record your baby's heartbeat or your contractions.  Placing monitors on your abdomen (external monitors) to record your baby's heartbeat and the frequency and length of contractions. You may not have to wear external monitors all the time.  Placing monitors inside of your uterus  (internal monitors) to record your baby's heartbeat and the frequency, length, and strength of your contractions. ? Your health care provider may use internal monitors if he or she needs more information about the strength of your contractions or your baby's heart rate. ? Internal monitors are put in place by passing a thin, flexible wire through your vagina and into your uterus. Depending on the type of monitor, it may remain in your uterus or on your baby's head until birth. ? Your health care provider will discuss the benefits and risks of internal monitoring with you and will ask for your permission before inserting the monitors.  Telemetry. This is a type of continuous monitoring that can be done with external or internal monitors. Instead of having to stay in bed, you are able to move around during telemetry. Ask your health care provider if telemetry is an option for you.  Physical exam Your health care provider may perform a physical exam. This may include:  Checking whether your baby is positioned: ? With the head toward your vagina (head-down). This is most common. ? With the head toward the top of your uterus (head-up or breech). If your baby is in a breech position, your health care provider may try to turn your baby to a head-down position so you can deliver vaginally. If it does not seem that your baby can be born vaginally, your provider may recommend surgery to deliver your baby. In rare cases, you may be able to deliver vaginally if your baby is head-up (breech delivery). ? Lying sideways (transverse). Babies that are lying sideways cannot be delivered vaginally.  Checking your cervix to determine: ? Whether it is thinning out (effacing). ? Whether it is opening up (dilating). ? How low your baby has moved into your birth canal.  What are the three stages of labor and delivery?  Normal labor and delivery is divided into the following three stages: Stage 1  Stage 1 is the  longest stage of labor, and it can last for hours or days. Stage 1 includes: ? Early labor. This is when contractions may be irregular, or regular and mild. Generally, early labor contractions are more than 10 minutes apart. ? Active labor. This is when contractions get longer, more regular, more frequent, and more intense. ? The transition phase. This is when contractions happen very close together, are very intense, and may last longer than during any other part of labor.  Contractions generally feel mild, infrequent, and irregular at first. They get stronger, more frequent (about every 2-3 minutes), and more regular as you progress from early labor through active labor and transition.  Many women progress through stage 1 naturally, but you may need help to continue making progress. If this happens, your health care provider may talk with you about: ? Rupturing your amniotic sac if it has not ruptured yet. ? Giving you medicine to help make your contractions stronger and more frequent.  Stage 1 ends when your cervix is completely dilated to 4 inches (10 cm) and completely effaced. This happens at the end of the transition phase. Stage 2  Once   your cervix is completely effaced and dilated to 4 inches (10 cm), you may start to feel an urge to push. It is common for the body to naturally take a rest before feeling the urge to push, especially if you received an epidural or certain other pain medicines. This rest period may last for up to 1-2 hours, depending on your unique labor experience.  During stage 2, contractions are generally less painful, because pushing helps relieve contraction pain. Instead of contraction pain, you may feel stretching and burning pain, especially when the widest part of your baby's head passes through the vaginal opening (crowning).  Your health care provider will closely monitor your pushing progress and your baby's progress through the vagina during stage 2.  Your  health care provider may massage the area of skin between your vaginal opening and anus (perineum) or apply warm compresses to your perineum. This helps it stretch as the baby's head starts to crown, which can help prevent perineal tearing. ? In some cases, an incision may be made in your perineum (episiotomy) to allow the baby to pass through the vaginal opening. An episiotomy helps to make the opening of the vagina larger to allow more room for the baby to fit through.  It is very important to breathe and focus so your health care provider can control the delivery of your baby's head. Your health care provider may have you decrease the intensity of your pushing, to help prevent perineal tearing.  After delivery of your baby's head, the shoulders and the rest of the body generally deliver very quickly and without difficulty.  Once your baby is delivered, the umbilical cord may be cut right away, or this may be delayed for 1-2 minutes, depending on your baby's health. This may vary among health care providers, hospitals, and birth centers.  If you and your baby are healthy enough, your baby may be placed on your chest or abdomen to help maintain the baby's temperature and to help you bond with each other. Some mothers and babies start breastfeeding at this time. Your health care team will dry your baby and help keep your baby warm during this time.  Your baby may need immediate care if he or she: ? Showed signs of distress during labor. ? Has a medical condition. ? Was born too early (prematurely). ? Had a bowel movement before birth (meconium). ? Shows signs of difficulty transitioning from being inside the uterus to being outside of the uterus. If you are planning to breastfeed, your health care team will help you begin a feeding. Stage 3  The third stage of labor starts immediately after the birth of your baby and ends after you deliver the placenta. The placenta is an organ that develops  during pregnancy to provide oxygen and nutrients to your baby in the womb.  Delivering the placenta may require some pushing, and you may have mild contractions. Breastfeeding can stimulate contractions to help you deliver the placenta.  After the placenta is delivered, your uterus should tighten (contract) and become firm. This helps to stop bleeding in your uterus. To help your uterus contract and to control bleeding, your health care provider may: ? Give you medicine by injection, through an IV tube, by mouth, or through your rectum (rectally). ? Massage your abdomen or perform a vaginal exam to remove any blood clots that are left in your uterus. ? Empty your bladder by placing a thin, flexible tube (catheter) into your bladder. ? Encourage   you to breastfeed your baby. After labor is over, you and your baby will be monitored closely to ensure that you are both healthy until you are ready to go home. Your health care team will teach you how to care for yourself and your baby. This information is not intended to replace advice given to you by your health care provider. Make sure you discuss any questions you have with your health care provider. Document Released: 01/11/2008 Document Revised: 10/22/2015 Document Reviewed: 04/18/2015 Elsevier Interactive Patient Education  2018 [employer]  

## 2018-01-31 ENCOUNTER — Telehealth (HOSPITAL_COMMUNITY): Payer: Self-pay | Admitting: *Deleted

## 2018-01-31 NOTE — Telephone Encounter (Signed)
Preadmission screen  

## 2018-02-06 ENCOUNTER — Inpatient Hospital Stay (HOSPITAL_COMMUNITY): Payer: Medicaid Other | Admitting: Anesthesiology

## 2018-02-06 ENCOUNTER — Other Ambulatory Visit: Payer: Self-pay

## 2018-02-06 ENCOUNTER — Inpatient Hospital Stay (HOSPITAL_COMMUNITY)
Admission: AD | Admit: 2018-02-06 | Discharge: 2018-02-08 | DRG: 807 | Disposition: A | Discharge status: Home / Self Care | Payer: Medicaid Other | Attending: Obstetrics & Gynecology | Admitting: Obstetrics & Gynecology

## 2018-02-06 ENCOUNTER — Encounter: Payer: Medicaid Other | Admitting: Obstetrics & Gynecology

## 2018-02-06 ENCOUNTER — Encounter (HOSPITAL_COMMUNITY): Payer: Self-pay | Admitting: *Deleted

## 2018-02-06 DIAGNOSIS — Z3A40 40 weeks gestation of pregnancy: Secondary | ICD-10-CM | POA: Diagnosis not present

## 2018-02-06 DIAGNOSIS — O479 False labor, unspecified: Secondary | ICD-10-CM

## 2018-02-06 DIAGNOSIS — O9832 Other infections with a predominantly sexual mode of transmission complicating childbirth: Secondary | ICD-10-CM | POA: Diagnosis present

## 2018-02-06 DIAGNOSIS — A6000 Herpesviral infection of urogenital system, unspecified: Secondary | ICD-10-CM | POA: Diagnosis present

## 2018-02-06 DIAGNOSIS — Z3483 Encounter for supervision of other normal pregnancy, third trimester: Secondary | ICD-10-CM | POA: Diagnosis present

## 2018-02-06 LAB — CBC
HCT: 37.3 % (ref 36.0–46.0)
HEMOGLOBIN: 12.6 g/dL (ref 12.0–15.0)
MCH: 30.9 pg (ref 26.0–34.0)
MCHC: 33.8 g/dL (ref 30.0–36.0)
MCV: 91.4 fL (ref 80.0–100.0)
PLATELETS: 145 10*3/uL — AB (ref 150–400)
RBC: 4.08 MIL/uL (ref 3.87–5.11)
RDW: 14.2 % (ref 11.5–15.5)
WBC: 14.2 10*3/uL — ABNORMAL HIGH (ref 4.0–10.5)
nRBC: 0 % (ref 0.0–0.2)

## 2018-02-06 LAB — TYPE AND SCREEN
ABO/RH(D): A POS
Antibody Screen: NEGATIVE

## 2018-02-06 LAB — ABO/RH: ABO/RH(D): A POS

## 2018-02-06 MED ORDER — LIDOCAINE HCL (PF) 1 % IJ SOLN
INTRAMUSCULAR | Status: DC | PRN
  Administered 2018-02-06 (×2): 6 mL via EPIDURAL

## 2018-02-06 MED ORDER — MORPHINE SULFATE (PF) 4 MG/ML IV SOLN
6.0000 mg | Freq: Once | INTRAVENOUS | Status: AC
  Administered 2018-02-06: 6 mg via INTRAVENOUS
  Filled 2018-02-06: qty 2

## 2018-02-06 MED ORDER — PHENYLEPHRINE 40 MCG/ML (10ML) SYRINGE FOR IV PUSH (FOR BLOOD PRESSURE SUPPORT)
80.0000 ug | PREFILLED_SYRINGE | INTRAVENOUS | Status: DC | PRN
  Filled 2018-02-06: qty 5
  Filled 2018-02-06: qty 10

## 2018-02-06 MED ORDER — SENNOSIDES-DOCUSATE SODIUM 8.6-50 MG PO TABS
2.0000 | ORAL_TABLET | ORAL | Status: DC
  Administered 2018-02-06 – 2018-02-07 (×2): 2 via ORAL
  Filled 2018-02-06 (×2): qty 2

## 2018-02-06 MED ORDER — COCONUT OIL OIL
1.0000 "application " | TOPICAL_OIL | Status: DC | PRN
  Filled 2018-02-06: qty 120

## 2018-02-06 MED ORDER — WITCH HAZEL-GLYCERIN EX PADS: 1.0000 "application " | MEDICATED_PAD | CUTANEOUS | Status: DC | PRN

## 2018-02-06 MED ORDER — DIBUCAINE 1 % RE OINT: 1.0000 "application " | TOPICAL_OINTMENT | RECTAL | Status: DC | PRN

## 2018-02-06 MED ORDER — OXYTOCIN 40 UNITS IN LACTATED RINGERS INFUSION - SIMPLE MED
2.5000 [IU]/h | INTRAVENOUS | Status: DC
  Filled 2018-02-06: qty 1000

## 2018-02-06 MED ORDER — ACETAMINOPHEN 325 MG PO TABS: 650.0000 mg | ORAL_TABLET | ORAL | Status: DC | PRN

## 2018-02-06 MED ORDER — LACTATED RINGERS IV SOLN
500.0000 mL | INTRAVENOUS | Status: DC | PRN
  Administered 2018-02-06: 1000 mL via INTRAVENOUS

## 2018-02-06 MED ORDER — LIDOCAINE HCL (PF) 1 % IJ SOLN
30.0000 mL | INTRAMUSCULAR | Status: DC | PRN
  Administered 2018-02-06: 30 mL via SUBCUTANEOUS
  Filled 2018-02-06: qty 30

## 2018-02-06 MED ORDER — PHENYLEPHRINE 40 MCG/ML (10ML) SYRINGE FOR IV PUSH (FOR BLOOD PRESSURE SUPPORT)
80.0000 ug | PREFILLED_SYRINGE | INTRAVENOUS | Status: DC | PRN
  Administered 2018-02-06 (×2): 80 ug via INTRAVENOUS
  Filled 2018-02-06: qty 5

## 2018-02-06 MED ORDER — FLEET ENEMA 7-19 GM/118ML RE ENEM: 1.0000 | ENEMA | RECTAL | Status: DC | PRN

## 2018-02-06 MED ORDER — MEASLES, MUMPS & RUBELLA VAC ~~LOC~~ INJ
0.5000 mL | INJECTION | Freq: Once | SUBCUTANEOUS | Status: AC
  Administered 2018-02-07: 0.5 mL via SUBCUTANEOUS
  Filled 2018-02-06 (×2): qty 0.5

## 2018-02-06 MED ORDER — FENTANYL 2.5 MCG/ML BUPIVACAINE 1/10 % EPIDURAL INFUSION (WH - ANES)
14.0000 mL/h | INTRAMUSCULAR | Status: DC | PRN
  Administered 2018-02-06: 14 mL/h via EPIDURAL
  Filled 2018-02-06: qty 100

## 2018-02-06 MED ORDER — VALACYCLOVIR HCL 500 MG PO TABS
1000.0000 mg | ORAL_TABLET | Freq: Every day | ORAL | Status: DC
  Filled 2018-02-06: qty 2

## 2018-02-06 MED ORDER — LACTATED RINGERS IV SOLN
INTRAVENOUS | Status: DC
  Administered 2018-02-06 (×4): via INTRAVENOUS

## 2018-02-06 MED ORDER — PROMETHAZINE HCL 25 MG/ML IJ SOLN
12.5000 mg | Freq: Once | INTRAMUSCULAR | Status: AC
  Administered 2018-02-06: 12.5 mg via INTRAVENOUS
  Filled 2018-02-06: qty 1

## 2018-02-06 MED ORDER — BENZOCAINE-MENTHOL 20-0.5 % EX AERO
1.0000 "application " | INHALATION_SPRAY | CUTANEOUS | Status: DC | PRN
  Administered 2018-02-07: 1 via TOPICAL
  Filled 2018-02-06: qty 56

## 2018-02-06 MED ORDER — ONDANSETRON HCL 4 MG/2ML IJ SOLN: 4.0000 mg | INTRAMUSCULAR | Status: DC | PRN

## 2018-02-06 MED ORDER — EPHEDRINE 5 MG/ML INJ
10.0000 mg | INTRAVENOUS | Status: DC | PRN
  Filled 2018-02-06: qty 2

## 2018-02-06 MED ORDER — OXYCODONE-ACETAMINOPHEN 5-325 MG PO TABS: 2.0000 | ORAL_TABLET | ORAL | Status: DC | PRN

## 2018-02-06 MED ORDER — DIPHENHYDRAMINE HCL 25 MG PO CAPS: 25.0000 mg | ORAL_CAPSULE | Freq: Four times a day (QID) | ORAL | Status: DC | PRN

## 2018-02-06 MED ORDER — LACTATED RINGERS IV SOLN
500.0000 mL | Freq: Once | INTRAVENOUS | Status: AC
  Administered 2018-02-06: 500 mL via INTRAVENOUS

## 2018-02-06 MED ORDER — ONDANSETRON HCL 4 MG PO TABS: 4.0000 mg | ORAL_TABLET | ORAL | Status: DC | PRN

## 2018-02-06 MED ORDER — DIPHENHYDRAMINE HCL 50 MG/ML IJ SOLN: 12.5000 mg | INTRAMUSCULAR | Status: DC | PRN

## 2018-02-06 MED ORDER — OXYTOCIN BOLUS FROM INFUSION
500.0000 mL | Freq: Once | INTRAVENOUS | Status: AC
  Administered 2018-02-06: 500 mL via INTRAVENOUS

## 2018-02-06 MED ORDER — PRENATAL MULTIVITAMIN CH
1.0000 | ORAL_TABLET | Freq: Every day | ORAL | Status: DC
  Administered 2018-02-07 – 2018-02-08 (×2): 1 via ORAL
  Filled 2018-02-06 (×2): qty 1

## 2018-02-06 MED ORDER — SIMETHICONE 80 MG PO CHEW: 80.0000 mg | CHEWABLE_TABLET | ORAL | Status: DC | PRN

## 2018-02-06 MED ORDER — ONDANSETRON HCL 4 MG/2ML IJ SOLN
4.0000 mg | Freq: Four times a day (QID) | INTRAMUSCULAR | Status: DC | PRN
  Administered 2018-02-06: 4 mg via INTRAVENOUS
  Filled 2018-02-06: qty 2

## 2018-02-06 MED ORDER — EPHEDRINE 5 MG/ML INJ
10.0000 mg | INTRAVENOUS | Status: DC | PRN
  Filled 2018-02-06: qty 2
  Filled 2018-02-06: qty 4

## 2018-02-06 MED ORDER — SOD CITRATE-CITRIC ACID 500-334 MG/5ML PO SOLN: 30.0000 mL | ORAL | Status: DC | PRN

## 2018-02-06 MED ORDER — OXYCODONE-ACETAMINOPHEN 5-325 MG PO TABS: 1.0000 | ORAL_TABLET | ORAL | Status: DC | PRN

## 2018-02-06 MED ORDER — IBUPROFEN 600 MG PO TABS
600.0000 mg | ORAL_TABLET | Freq: Four times a day (QID) | ORAL | Status: DC
  Administered 2018-02-06 – 2018-02-08 (×7): 600 mg via ORAL
  Filled 2018-02-06 (×7): qty 1

## 2018-02-06 MED ORDER — FENTANYL CITRATE (PF) 100 MCG/2ML IJ SOLN
100.0000 ug | INTRAMUSCULAR | Status: DC | PRN
  Administered 2018-02-06: 100 ug via INTRAVENOUS
  Filled 2018-02-06: qty 2

## 2018-02-06 MED ORDER — TETANUS-DIPHTH-ACELL PERTUSSIS 5-2.5-18.5 LF-MCG/0.5 IM SUSP
0.5000 mL | Freq: Once | INTRAMUSCULAR | Status: AC
  Administered 2018-02-08: 0.5 mL via INTRAMUSCULAR
  Filled 2018-02-06: qty 0.5

## 2018-02-06 NOTE — H&P (Addendum)
LABOR AND DELIVERY ADMISSION HISTORY AND PHYSICAL NOTE  Kristy Davis is a 27 y.o. female G1P0000 with IUP at [redacted]w[redacted]d by LMP presenting for SROM and early labor. She presented for increasing contractions that started around 0400 this morning, that have increased in severity and frequency. She had SROM while in the MAU, moderate meconium stained fluid. She reports positive fetal movement. Denies any vaginal bleeding, HA, or blurry vision.   Prenatal History/Complications: PNC at [facility]  Pregnancy complications:  - Genital Herpes complicating pregnancy, on suppression therapy   Past Medical History: Past Medical History:  Diagnosis Date  . Allergy   . Migraine   . Ovarian cyst     Past Surgical History: Past Surgical History:  Procedure Laterality Date  . WISDOM TOOTH EXTRACTION      Obstetrical History: OB History    Gravida  1   Para  0   Term  0   Preterm  0   AB  0   Living  0     SAB  0   TAB  0   Ectopic  0   Multiple  0   Live Births  0           Social History: Social History   Socioeconomic History  . Marital status: Single    Spouse name: n/a  . Number of children: 0  . Years of education: 12+  . Highest education level: Not on file  Occupational History  . Occupation: Runner, broadcasting/film/video    Comment: [employer]  Social Needs  . Financial resource strain: Not hard at all  . Food insecurity:    Worry: Never true    Inability: Never true  . Transportation needs:    Medical: No    Non-medical: No  Tobacco Use  . Smoking status: Never Smoker  . Smokeless tobacco: Never Used  Substance and Sexual Activity  . Alcohol use: Not Currently    Alcohol/week: 0.0 standard drinks    Comment: occasion  . Drug use: No  . Sexual activity: Yes    Birth control/protection: Condom  Lifestyle  . Physical activity:    Days per week: 0 days    Minutes per session: 0 min  . Stress: Not at all  Relationships  . Social connections:    Talks on phone:  More than three times a week    Gets together: More than three times a week    Attends religious service: Not on file    Active member of club or organization: Not on file    Attends meetings of clubs or organizations: Not on file    Relationship status: Not on file  Other Topics Concern  . Not on file  Social History Narrative   Lives with her parents. Brother lives independently.    Family History: History reviewed. No pertinent family history.  Allergies: No Known Allergies  Medications Prior to Admission  Medication Sig Dispense Refill Last Dose  . loratadine (CLARITIN) 10 MG tablet Take 1 tablet (10 mg total) by mouth daily. 30 tablet 11 02/05/2018 at Unknown time  . prenatal vitamin w/FE, FA (PRENATAL 1 + 1) 27-1 MG TABS tablet Take 1 tablet by mouth daily before breakfast. 90 each 4 02/05/2018 at Unknown time  . valACYclovir (VALTREX) 1000 MG tablet Take 1 tablet (1,000 mg total) by mouth daily. 30 tablet 1 02/05/2018 at Unknown time  . polyethylene glycol (MIRALAX / GLYCOLAX) packet Take 17 g by mouth daily. (Patient not taking:  Reported on 02/06/2018) 14 each 0 Not Taking at Unknown time     Review of Systems  All systems reviewed and negative except as stated in HPI  Physical Exam Blood pressure 117/63, pulse 83, temperature 97.8 F (36.6 C), temperature source Oral, resp. rate 16, height 5\' 3"  (1.6 m), weight 83.9 kg, last menstrual period 05/02/2017, unknown if currently breastfeeding. General appearance: alert, oriented, NAD Lungs: normal respiratory effort Heart: regular rate Abdomen: soft, non-tender; gravid, FH appropriate for GA Extremities: No calf swelling or tenderness Presentation: cephalic Fetal monitoring: Baseline 130, mod var, + accel, - decels  Uterine activity: every 2-3 min  Dilation: 3 Effacement (%): 100 Station: -2 Exam by:: ginger morris rn  Prenatal labs: ABO, Rh: --/--/A POS (10/23 1333) Antibody: NEG (10/23 1333) Rubella: 0.91 (04/02  1158) RPR: Non Reactive (07/31 1015)  HBsAg: Negative (04/02 1158)  HIV: Non Reactive (07/31 1015)  GC/Chlamydia: Negative  GBS: Negative (09/25 1009)  2-hr GTT: normal  Genetic screening:  Declined  Anatomy US: normal   Prenatal Transfer Tool  Maternal Diabetes: No Genetic Screening: Declined Maternal Ultrasounds/Referrals: Normal Fetal Ultrasounds or other Referrals:  None Maternal Substance Abuse:  No Significant Maternal Medications:  None Significant Maternal Lab Results: None  Results for orders placed or performed during the hospital encounter of 02/06/18 (from the past 24 hour(s))  CBC   Collection Time: 02/06/18  1:33 PM  Result Value Ref Range   WBC 14.2 (H) 4.0 - 10.5 K/uL   RBC 4.08 3.87 - 5.11 MIL/uL   Hemoglobin 12.6 12.0 - 15.0 g/dL   HCT 16.1 09.6 - 04.5 %   MCV 91.4 80.0 - 100.0 fL   MCH 30.9 26.0 - 34.0 pg   MCHC 33.8 30.0 - 36.0 g/dL   RDW 40.9 81.1 - 91.4 %   Platelets 145 (L) 150 - 400 K/uL   nRBC 0.0 0.0 - 0.2 %  Type and screen [facility]   Collection Time: 02/06/18  1:33 PM  Result Value Ref Range   ABO/RH(D) A POS    Antibody Screen NEG    Sample Expiration      02/09/2018 Performed at [facility], [address] [place], [place] [address]     Patient Active Problem List   Diagnosis Date Noted  . Labor and delivery, indication for care 02/06/2018  . Genital herpes affecting pregnancy 12/26/2017  . Supervision of normal pregnancy, antepartum 07/16/2017  . Migraine     Assessment: Kristy Davis is a 27 y.o. G1P0000 at [redacted]w[redacted]d here for SROM w/ moderate meconium stained fluid and early labor. Pregnancy complicated by genital HSV on valtrex suppression therapy. GBS negative.   #Labor: Expectant management, contracting every 2-3 minutes. Serial cervical exams.   #Pain: IV fent currently, desires a natural delivery, however considering Epidural   #FWB: Cat 1 strip  #ID: GBS negative  #MOF: Breastfeeding  #MOC:  Undecided, aware of options  #Circ:  Yes, outpatient   1. Genital HSV: on Valtrex 1000mg  daily, no active lesions.   -Cont daily Valtrex   Allayne Stack 02/06/2018, 2:50 PM   OB FELLOW HISTORY AND PHYSICAL ATTESTATION  I have seen and examined this patient; I agree with above documentation in the resident's note.   Gwenevere Abbot, MD OB Fellow  02/06/2018, 8:43 PM

## 2018-02-06 NOTE — Anesthesia Procedure Notes (Signed)
Epidural Patient location during procedure: OB Start time: 02/06/2018 3:57 PM End time: 02/06/2018 4:01 PM  Staffing Anesthesiologist: Leilani Able, MD Performed: anesthesiologist   Preanesthetic Checklist Completed: patient identified, site marked, surgical consent, pre-op evaluation, timeout performed, IV checked, risks and benefits discussed and monitors and equipment checked  Epidural Patient position: sitting Prep: site prepped and draped and DuraPrep Patient monitoring: continuous pulse ox and blood pressure Approach: midline Location: L3-L4 Injection technique: LOR air  Needle:  Needle type: Tuohy  Needle gauge: 17 G Needle length: 9 cm and 9 Needle insertion depth: 5 cm cm Catheter type: closed end flexible Catheter size: 19 Gauge Catheter at skin depth: 10 cm Test dose: negative and Other  Assessment Sensory level: T9 Events: blood not aspirated, injection not painful, no injection resistance, negative IV test and no paresthesia  Additional Notes Reason for block:procedure for pain

## 2018-02-06 NOTE — Discharge Instructions (Signed)
Braxton Hicks Contractions °Contractions of the uterus can occur throughout pregnancy, but they are not always a sign that you are in labor. You may have practice contractions called Braxton Hicks contractions. These false labor contractions are sometimes confused with true labor. °What are Braxton Hicks contractions? °Braxton Hicks contractions are tightening movements that occur in the muscles of the uterus before labor. Unlike true labor contractions, these contractions do not result in opening (dilation) and thinning of the cervix. Toward the end of pregnancy (32-34 weeks), Braxton Hicks contractions can happen more often and may become stronger. These contractions are sometimes difficult to tell apart from true labor because they can be very uncomfortable. You should not feel embarrassed if you go to the hospital with false labor. °Sometimes, the only way to tell if you are in true labor is for your health care provider to look for changes in the cervix. The health care provider will do a physical exam and may monitor your contractions. If you are not in true labor, the exam should show that your cervix is not dilating and your water has not broken. °If there are other health problems associated with your pregnancy, it is completely safe for you to be sent home with false labor. You may continue to have Braxton Hicks contractions until you go into true labor. °How to tell the difference between true labor and false labor °True labor °· Contractions last 30-70 seconds. °· Contractions become very regular. °· Discomfort is usually felt in the top of the uterus, and it spreads to the lower abdomen and low back. °· Contractions do not go away with walking. °· Contractions usually become more intense and increase in frequency. °· The cervix dilates and gets thinner. °False labor °· Contractions are usually shorter and not as strong as true labor contractions. °· Contractions are usually irregular. °· Contractions  are often felt in the front of the lower abdomen and in the groin. °· Contractions may go away when you walk around or change positions while lying down. °· Contractions get weaker and are shorter-lasting as time goes on. °· The cervix usually does not dilate or become thin. °Follow these instructions at home: °· Take over-the-counter and prescription medicines only as told by your health care provider. °· Keep up with your usual exercises and follow other instructions from your health care provider. °· Eat and drink lightly if you think you are going into labor. °· If Braxton Hicks contractions are making you uncomfortable: °? Change your position from lying down or resting to walking, or change from walking to resting. °? Sit and rest in a tub of warm water. °? Drink enough fluid to keep your urine pale yellow. Dehydration may cause these contractions. °? Do slow and deep breathing several times an hour. °· Keep all follow-up prenatal visits as told by your health care provider. This is important. °Contact a health care provider if: °· You have a fever. °· You have continuous pain in your abdomen. °Get help right away if: °· Your contractions become stronger, more regular, and closer together. °· You have fluid leaking or gushing from your vagina. °· You pass blood-tinged mucus (bloody show). °· You have bleeding from your vagina. °· You have low back pain that you never had before. °· You feel your baby’s head pushing down and causing pelvic pressure. °· Your baby is not moving inside you as much as it used to. °Summary °· Contractions that occur before labor are called Braxton   Hicks contractions, false labor, or practice contractions. °· Braxton Hicks contractions are usually shorter, weaker, farther apart, and less regular than true labor contractions. True labor contractions usually become progressively stronger and regular and they become more frequent. °· Manage discomfort from Braxton Hicks contractions by  changing position, resting in a warm bath, drinking plenty of water, or practicing deep breathing. °This information is not intended to replace advice given to you by your health care provider. Make sure you discuss any questions you have with your health care provider. °Document Released: 08/17/2016 Document Revised: 08/17/2016 Document Reviewed: 08/17/2016 °Elsevier Interactive Patient Education © 2018 [employer] Inc[employer] ° °

## 2018-02-06 NOTE — MAU Note (Addendum)
Pt presents to MAU with complaints of contractions since 3 this morning.Scant amount amount of VB, denies any LOF

## 2018-02-06 NOTE — Anesthesia Preprocedure Evaluation (Signed)
Anesthesia Evaluation  Patient identified by MRN, date of birth, ID band Patient awake    Reviewed: Allergy & Precautions, H&P , NPO status , Patient's Chart, lab work & pertinent test results  Airway Mallampati: II  TM Distance: >3 FB Neck ROM: full    Dental no notable dental hx. (+) Teeth Intact   Pulmonary neg pulmonary ROS,    Pulmonary exam normal breath sounds clear to auscultation       Cardiovascular negative cardio ROS Normal cardiovascular exam Rhythm:regular Rate:Normal     Neuro/Psych negative psych ROS   GI/Hepatic negative GI ROS, Neg liver ROS,   Endo/Other  negative endocrine ROS  Renal/GU negative Renal ROS  negative genitourinary   Musculoskeletal negative musculoskeletal ROS (+)   Abdominal (+) + obese,   Peds  Hematology negative hematology ROS (+)   Anesthesia Other Findings   Reproductive/Obstetrics (+) Pregnancy                             Anesthesia Physical Anesthesia Plan  ASA: II  Anesthesia Plan: Epidural   Post-op Pain Management:    Induction:   PONV Risk Score and Plan:   Airway Management Planned:   Additional Equipment:   Intra-op Plan:   Post-operative Plan:   Informed Consent: I have reviewed the patients History and Physical, chart, labs and discussed the procedure including the risks, benefits and alternatives for the proposed anesthesia with the patient or authorized representative who has indicated his/her understanding and acceptance.       Plan Discussed with:   Anesthesia Plan Comments:         Anesthesia Quick Evaluation  

## 2018-02-06 NOTE — MAU Provider Note (Deleted)
Discussed case with Ginger, RN in MAU. Kristy Davis is a 27 year old G1P0 at [redacted]w[redacted]d that presented with painful contractions every 2-3 min. Last cervical exam 2/90, minimal change made in >2 hours observed. FHT appropriate and reassuring. Patient has a ride home, will provide therapeutic rest with IV morphine and added phenergan prior to discharge. RN to provide strict return precautions.

## 2018-02-06 NOTE — Progress Notes (Signed)
LABOR PROGRESS NOTE  Kristy Davis is a 27 y.o. G1P0000 at [redacted]w[redacted]d  admitted for SOL.   Subjective: Strip Note   Objective: BP (!) 104/59   Pulse 86   Temp 98.6 F (37 C) (Axillary)   Resp 19   Ht 5\' 3"  (1.6 m)   Wt 83.9 kg   LMP 05/02/2017   SpO2 100%   BMI 32.77 kg/m  or  Vitals:   02/06/18 1815 02/06/18 1820 02/06/18 1825 02/06/18 1829  BP:      Pulse:      Resp:    19  Temp:    98.6 F (37 C)  TempSrc:    Axillary  SpO2: 99% 96% 100%   Weight:      Height:        Dilation: 10 Dilation Complete Date: 02/06/18 Dilation Complete Time: 1757 Effacement (%): 100 Cervical Position: Middle Station: 0 Presentation: Vertex Exam by:: Mary [place] Johnson, RN  FHT: baseline rate 120's, moderate varibility, +acel, now having late decels Toco: Every 2-3   Labs: Lab Results  Component Value Date   WBC 14.2 (H) 02/06/2018   HGB 12.6 02/06/2018   HCT 37.3 02/06/2018   MCV 91.4 02/06/2018   PLT 145 (L) 02/06/2018    Patient Active Problem List   Diagnosis Date Noted  . Labor and delivery, indication for care 02/06/2018  . Genital herpes affecting pregnancy 12/26/2017  . Supervision of normal pregnancy, antepartum 07/16/2017  . Migraine     Assessment / Plan: 27 y.o. G1P0000 at [redacted]w[redacted]d here for SOL. GBS negative.   Labor: SOL, fully dilated. Currently laboring down, -1 station on last evaluation.  Fetal Wellbeing:  Having recurrent minimal lates with good return to baseline, will monitor closely with position changes. Consideration for re-trial of pushing if continue to have decels not improved with conservative therapies.  Pain Control:  Epidural  Anticipated MOD:  NSVD   Leticia Penna, D.O. Family Medicine PGY-1  02/06/2018, 6:30 PM

## 2018-02-06 NOTE — MAU Note (Signed)
Urine in lab 

## 2018-02-07 DIAGNOSIS — Z3A40 40 weeks gestation of pregnancy: Secondary | ICD-10-CM

## 2018-02-07 LAB — RPR: RPR: NONREACTIVE

## 2018-02-07 NOTE — Lactation Note (Signed)
This note was copied from a baby's chart. Lactation Consultation Note  Patient Name: Kristy Davis [id number] Date: 02/07/2018 Reason for consult: Initial assessment;Primapara;1st time breastfeeding;Term  Baby is 23 hours old  LC reviewed doc flow sheets and updated per mom.  As LC entered the room , grandmother holding baby  and per mom ready feed.  LC offered to the check diaper , medium stool changed. And baby placed STS with mom.  LC assisted mom to latch on the right breast / cross cradle/ and worked on depth/ baby fed 11 mins with swallows.  LC reviewed breast feeding basics - and prior to the baby latching reviewed hand expressing and noted with drops.  LC recommended making sure the areola is compressible prior to latch and firm support.  Mother informed of post-discharge support and given phone number to the lactation department, including services for phone call assistance; out-patient appointments; and breastfeeding support group. List of other breastfeeding resources in the community given in the handout. Encouraged mother to call for problems or concerns related to breastfeeding.    Maternal Data Has patient been taught Hand Expression?: Yes Does the patient have breastfeeding experience prior to this delivery?: No  Feeding Feeding Type: Breast Fed  LATCH Score ( Latch Score by the [facility] and RN orientee )  Latch: Repeated attempts needed to sustain latch, nipple held in mouth throughout feeding, stimulation needed to elicit sucking reflex.  Audible Swallowing: Spontaneous and intermittent  Type of Nipple: Everted at rest and after stimulation(compressible areolas )  Comfort (Breast/Nipple): Soft / non-tender  Hold (Positioning): Assistance needed to correctly position infant at breast and maintain latch.  LATCH Score: 8  Interventions Interventions: Breast feeding basics reviewed;Assisted with latch;Skin to skin;Breast massage;Reverse pressure;Breast  compression;Adjust position;Support pillows;Position options  Lactation Tools Discussed/Used     Consult Status Consult Status: Follow-up Date: 02/08/18 Follow-up type: In-patient    [name] 02/07/2018, 7:19 PM

## 2018-02-07 NOTE — Anesthesia Postprocedure Evaluation (Signed)
Anesthesia Post Note  Patient: FLOIS MCTAGUE  Procedure(s) Performed: AN AD HOC LABOR EPIDURAL     Patient location during evaluation: Mother Baby Anesthesia Type: Epidural Level of consciousness: awake and alert and oriented Pain management: satisfactory to patient Vital Signs Assessment: post-procedure vital signs reviewed and stable Respiratory status: respiratory function stable Cardiovascular status: stable Postop Assessment: no headache, no backache, epidural receding, patient able to bend at knees, no signs of nausea or vomiting and adequate PO intake Anesthetic complications: no    Last Vitals:  Vitals:   02/07/18 0323 02/07/18 0720  BP: 100/63 97/61  Pulse: 78 80  Resp: 18 20  Temp: 36.7 C 36.7 C  SpO2: 100% 100%    Last Pain:  Vitals:   02/07/18 0720  TempSrc: Oral  PainSc: 0-No pain   Pain Goal: Patients Stated Pain Goal: 0 (02/06/18 2107)               Karleen Dolphin

## 2018-02-07 NOTE — Progress Notes (Signed)
POSTPARTUM PROGRESS NOTE  Post Partum Day 1  Subjective:  Kristy Davis is a 27 y.o. G1P1001 s/p NSVD at [redacted]w[redacted]d.  She reports she is doing well and has still not made a decision regarding birth control method. No acute events overnight. She denies any problems with ambulating, voiding or po intake. No BMs. Passing gas. Denies nausea or vomiting.  Pain is well controlled.  Lochia is decreased from prior.   Objective: Blood pressure 97/61, pulse 80, temperature 98.1 F (36.7 C), temperature source Oral, resp. rate 20, height 5\' 3"  (1.6 m), weight 83.9 kg, last menstrual period 05/02/2017, SpO2 100 %, unknown if currently breastfeeding.  Physical Exam:  General: alert, cooperative and no distress Chest: no respiratory distress Heart:regular rate, distal pulses intact Abdomen: soft, nontender Uterine Fundus: firm, appropriately tender DVT Evaluation: No calf swelling or tenderness Extremities: No edema Skin: warm, dry  Recent Labs    02/06/18 1333  HGB 12.6  HCT 37.3    Assessment/Plan: Kristy Davis is a 27 y.o. G1P1001 s/p NSPVD at [redacted]w[redacted]d   PPD#1 - Doing well  Routine postpartum care  Contraception: undecided Feeding: breastfeed Dispo: Plan for discharge tomorrow   LOS: 1 day   Raul Del, MS3 02/07/2018, 7:23 AM

## 2018-02-08 MED ORDER — IBUPROFEN 600 MG PO TABS: 600.0000 mg | ORAL_TABLET | Freq: Three times a day (TID) | ORAL | 0 refills | Status: DC

## 2018-02-08 MED ORDER — SENNOSIDES-DOCUSATE SODIUM 8.6-50 MG PO TABS: 2.0000 | ORAL_TABLET | ORAL | 0 refills | Status: DC

## 2018-02-08 NOTE — Discharge Summary (Addendum)
OB Discharge Summary     Patient Name: Kristy Davis DOB: 01-Sep-1990 MRN: [id number]  Date of admission: 02/06/2018 Delivering MD: Gwenevere Abbot   Date of discharge: 02/08/2018  Admitting diagnosis: LABOR Intrauterine pregnancy: [redacted]w[redacted]d     Secondary diagnosis:  Active Problems:   Labor and delivery, indication for care  Additional problems: Genital herpes on chronic suppression therapy      Discharge diagnosis: Term Pregnancy Delivered                                                                                                Post partum procedures:None   Augmentation: None   Complications: None  Hospital course:  Onset of Labor With Vaginal Delivery     27 y.o. yo G1P1001 at [redacted]w[redacted]d was admitted in Latent Labor on 02/06/2018. Patient had an uncomplicated labor course as follows:  Membrane Rupture Time/Date: 1:10 PM ,02/06/2018   Intrapartum Procedures: Episiotomy: None [1]                                         Lacerations:  2nd degree [3];Perineal [11]  Patient had a delivery of a Viable infant. 02/06/2018  Information for the patient's newborn:  Sulema, Braid [[id number]]  Delivery Method: Vaginal, Spontaneous(Filed from Delivery Summary)    Pateint had an uncomplicated postpartum course.  She is ambulating, tolerating a regular diet, passing flatus, and urinating well. Patient is discharged home in stable condition on 02/08/18.   Physical exam  Vitals:   02/07/18 1043 02/07/18 1355 02/07/18 2118 02/08/18 0624  BP: 103/61 97/68 97/62  97/60  Pulse: 91 80 84 78  Resp:  16 17 16   Temp: 98.1 F (36.7 C) 97.6 F (36.4 C) 98.3 F (36.8 C) 98 F (36.7 C)  TempSrc: Oral Oral Oral Oral  SpO2: 99%  99% 100%  Weight:      Height:       General: alert, cooperative and no distress Lochia: appropriate Uterine Fundus: firm Incision: N/A DVT Evaluation: No evidence of DVT seen on physical exam. No significant calf/ankle edema. Labs: Lab Results   Component Value Date   WBC 14.2 (H) 02/06/2018   HGB 12.6 02/06/2018   HCT 37.3 02/06/2018   MCV 91.4 02/06/2018   PLT 145 (L) 02/06/2018   CMP Latest Ref Rng & Units 08/24/2017  Glucose 65 - 99 mg/dL 83  BUN 6 - 20 mg/dL 7  Creatinine 3.71 - 6.96 mg/dL 7.89  Sodium 381 - 017 mmol/L 136  Potassium 3.5 - 5.1 mmol/L 4.4  Chloride 101 - 111 mmol/L 104  CO2 22 - 32 mmol/L 22  Calcium 8.9 - 10.3 mg/dL 8.9  Total Protein 6.5 - 8.1 g/dL 6.7  Total Bilirubin 0.3 - 1.2 mg/dL 0.7  Alkaline Phos 38 - 126 U/L 54  AST 15 - 41 U/L 18  ALT 14 - 54 U/L 10(L)    Discharge instruction: per After Visit Summary and "Baby and Me Booklet".  After visit meds:  Allergies  as of 02/08/2018   No Known Allergies     Medication List    TAKE these medications   ibuprofen 600 MG tablet Commonly known as:  ADVIL,MOTRIN Take 1 tablet (600 mg total) by mouth 3 (three) times daily.   loratadine 10 MG tablet Commonly known as:  CLARITIN Take 1 tablet (10 mg total) by mouth daily.   polyethylene glycol packet Commonly known as:  MIRALAX / GLYCOLAX Take 17 g by mouth daily.   prenatal vitamin w/FE, FA 27-1 MG Tabs tablet Take 1 tablet by mouth daily before breakfast.   senna-docusate 8.6-50 MG tablet Commonly known as:  Senokot-S Take 2 tablets by mouth daily. Start taking on:  02/09/2018   valACYclovir 1000 MG tablet Commonly known as:  VALTREX Take 1 tablet (1,000 mg total) by mouth daily.       Diet: routine diet  Activity: Advance as tolerated. Pelvic rest for 6 weeks.   Outpatient follow up:4 weeks Follow up Appt: Future Appointments  Date Time Provider Department Center  03/07/2018  9:00 AM Hermina Staggers, MD [facility] None   Follow up Visit:No follow-ups on file.  Postpartum contraception: Combination OCPs  Newborn Data: Live born female  Birth Weight: 6 lb 10 oz (3005 g) APGAR: 9, 9  Newborn Delivery   Birth date/time:  02/06/2018 19:43:00 Delivery type:  Vaginal,  Spontaneous     Baby Feeding: Bottle and Breast Disposition:home with mother   02/08/2018 Allayne Stack, DO  OB FELLOW DISCHARGE ATTESTATION  I have seen and examined this patient and agree with above documentation in the resident's note.   Gwenevere Abbot, MD OB Fellow  02/08/2018, 7:23 PM

## 2018-02-08 NOTE — Lactation Note (Signed)
This note was copied from a baby's chart. Lactation Consultation Note  Patient Name: Kristy Davis BJYNW'[id number] Date: 02/08/2018 Reason for consult: Follow-up assessment;1st time breastfeeding;Primapara;Term;Infant weight loss(6% weight loss )  As LC entered the room mom holding baby  and he was asleep.  Per mom  last fed at 0800 for 30 mins. And noted swallows.  Mom mentioned latching on the right side has gotten easier.  Also the night nurse instructed mom on the use of the hand pump,  The #24 F was tight, LC provided mom with #27 F.  Sore nipple and engorgement prevention and tx reviewed.  Mom has coconut oil at the bedside, mom did not mentioned she was sore.  Mom was able to hand express well last evening at the consult.  Discussed nutritive vs non - nutritive feeding patterns and the importance of  Watching the baby for hanging out latched.  Importance of STS feedings until the baby is back to birth weight.  Mother informed of post-discharge support and given phone number to the lactation department, including services for phone call assistance; out-patient appointments; and breastfeeding support group. List of other breastfeeding resources in the community given in the handout. Encouraged mother to call for problems or concerns related to breastfeeding.     Maternal Data Has patient been taught Hand Expression?: Yes  Feeding Feeding Type: (per mom last fed at 8 am for 30 mins )  LATCH Score                   Interventions Interventions: Breast feeding basics reviewed;Hand pump;Coconut oil  Lactation Tools Discussed/Used Tools: Flanges;Pump Flange Size: 27;24;Other (comment)(per mom felt the #24 F was to tight , #27 provided ) Breast pump type: Manual Pump Review: Setup, frequency, and cleaning;Milk Storage Initiated by:: LC - MAI reviewed  Date initiated:: 02/08/18   Consult Status Consult Status: Complete Date: 02/08/18    Kathrin Greathouse 02/08/2018, 9:27 AM

## 2018-02-13 ENCOUNTER — Inpatient Hospital Stay (HOSPITAL_COMMUNITY): Admission: RE | Admit: 2018-02-13 | Payer: Medicaid Other | Source: Ambulatory Visit

## 2018-03-07 ENCOUNTER — Ambulatory Visit (INDEPENDENT_AMBULATORY_CARE_PROVIDER_SITE_OTHER): Payer: Medicaid Other | Admitting: Obstetrics and Gynecology

## 2018-03-07 ENCOUNTER — Encounter: Payer: Self-pay | Admitting: Obstetrics and Gynecology

## 2018-03-07 DIAGNOSIS — Z309 Encounter for contraceptive management, unspecified: Secondary | ICD-10-CM | POA: Insufficient documentation

## 2018-03-07 DIAGNOSIS — Z30011 Encounter for initial prescription of contraceptive pills: Secondary | ICD-10-CM

## 2018-03-07 DIAGNOSIS — Z1389 Encounter for screening for other disorder: Secondary | ICD-10-CM | POA: Diagnosis not present

## 2018-03-07 MED ORDER — NORETHINDRONE 0.35 MG PO TABS: 1.0000 | ORAL_TABLET | Freq: Every day | ORAL | 11 refills | Status: DC

## 2018-03-07 NOTE — Progress Notes (Signed)
Post Partum Exam  Kristy JulianQuatisha T Davis is a 27 y.o. 571P1001 female who presents for a postpartum visit. She is 4 weeks postpartum following a spontaneous vaginal delivery. I have fully reviewed the prenatal and intrapartum course. The delivery was at 40 gestational weeks.  Anesthesia: epidural. Postpartum course has been well. Baby's course has been well. Baby is feeding by breast. Bleeding no bleeding. Bowel function is abnormal: constipation . Bladder function is normal. Patient is not sexually active. Contraception method is none. Postpartum depression screening:neg  The following portions of the patient's history were reviewed and updated as appropriate: allergies, current medications, past family history, past medical history, past social history and past surgical history. Last pap smear done 07/17/2017 and was Normal  Review of Systems Pertinent items are noted in HPI.    Objective:  Last menstrual period 05/02/2017, unknown if currently breastfeeding.  General:  alert   Breasts:  deferred  Lungs: clear to auscultation bilaterally  Heart:  regular rate and rhythm, S1, S2 normal, no murmur, click, rub or gallop  Abdomen: soft, non-tender; bowel sounds normal; no masses,  no organomegaly   Vulva:  not evaluated  Vagina: not evaluated  Cervix:  not evaluated  Corpus: not examined  Adnexa:  not evaluated  Rectal Exam: Not performed.        Assessment:    Nl postpartum exam.   Contraception management  Plan:   1. Contraception: oral progesterone-only contraceptive 2. Return to nl ADL's 3. Follow up in: 1 yr or as needed.

## 2018-03-07 NOTE — Patient Instructions (Signed)

## 2020-09-16 ENCOUNTER — Other Ambulatory Visit: Payer: Self-pay

## 2020-09-16 ENCOUNTER — Encounter (HOSPITAL_COMMUNITY): Payer: Self-pay

## 2020-09-16 ENCOUNTER — Emergency Department (HOSPITAL_COMMUNITY)
Admission: EM | Admit: 2020-09-16 | Discharge: 2020-09-16 | Disposition: A | Discharge status: Home / Self Care | Payer: Medicaid Other | Attending: Emergency Medicine | Admitting: Emergency Medicine

## 2020-09-16 DIAGNOSIS — R519 Headache, unspecified: Secondary | ICD-10-CM | POA: Insufficient documentation

## 2020-09-16 DIAGNOSIS — Z20822 Contact with and (suspected) exposure to covid-19: Secondary | ICD-10-CM | POA: Insufficient documentation

## 2020-09-16 DIAGNOSIS — R6883 Chills (without fever): Secondary | ICD-10-CM | POA: Insufficient documentation

## 2020-09-16 DIAGNOSIS — R42 Dizziness and giddiness: Secondary | ICD-10-CM | POA: Insufficient documentation

## 2020-09-16 DIAGNOSIS — R112 Nausea with vomiting, unspecified: Secondary | ICD-10-CM | POA: Insufficient documentation

## 2020-09-16 LAB — CBC WITH DIFFERENTIAL/PLATELET
Abs Immature Granulocytes: 0.04 10*3/uL (ref 0.00–0.07)
Basophils Absolute: 0 10*3/uL (ref 0.0–0.1)
Basophils Relative: 0 %
Eosinophils Absolute: 0 10*3/uL (ref 0.0–0.5)
Eosinophils Relative: 0 %
HCT: 38 % (ref 36.0–46.0)
Hemoglobin: 12.7 g/dL (ref 12.0–15.0)
Immature Granulocytes: 0 %
Lymphocytes Relative: 11 %
Lymphs Abs: 1.1 10*3/uL (ref 0.7–4.0)
MCH: 29.9 pg (ref 26.0–34.0)
MCHC: 33.4 g/dL (ref 30.0–36.0)
MCV: 89.4 fL (ref 80.0–100.0)
Monocytes Absolute: 0.2 10*3/uL (ref 0.1–1.0)
Monocytes Relative: 2 %
Neutro Abs: 8.9 10*3/uL — ABNORMAL HIGH (ref 1.7–7.7)
Neutrophils Relative %: 87 %
Platelets: 213 10*3/uL (ref 150–400)
RBC: 4.25 MIL/uL (ref 3.87–5.11)
RDW: 12.4 % (ref 11.5–15.5)
WBC: 10.3 10*3/uL (ref 4.0–10.5)
nRBC: 0 % (ref 0.0–0.2)

## 2020-09-16 LAB — BASIC METABOLIC PANEL
Anion gap: 8 (ref 5–15)
BUN: 11 mg/dL (ref 6–20)
CO2: 20 mmol/L — ABNORMAL LOW (ref 22–32)
Calcium: 9.3 mg/dL (ref 8.9–10.3)
Chloride: 107 mmol/L (ref 98–111)
Creatinine, Ser: 0.77 mg/dL (ref 0.44–1.00)
GFR, Estimated: >60 mL/min (ref >60)
Glucose, Bld: 140 mg/dL — ABNORMAL HIGH (ref 70–99)
Potassium: 3.7 mmol/L (ref 3.5–5.1)
Sodium: 135 mmol/L (ref 135–145)

## 2020-09-16 LAB — I-STAT BETA HCG BLOOD, ED (MC, WL, AP ONLY): I-stat hCG, quantitative: <5 m[IU]/mL (ref <5)

## 2020-09-16 LAB — SARS CORONAVIRUS 2 (TAT 6-24 HRS): SARS Coronavirus 2: NEGATIVE

## 2020-09-16 MED ORDER — METOCLOPRAMIDE HCL 5 MG/ML IJ SOLN
10.0000 mg | Freq: Once | INTRAMUSCULAR | Status: AC
  Administered 2020-09-16: 10 mg via INTRAVENOUS
  Filled 2020-09-16: qty 2

## 2020-09-16 MED ORDER — LACTATED RINGERS IV BOLUS
1000.0000 mL | Freq: Once | INTRAVENOUS | Status: AC
  Administered 2020-09-16: 1000 mL via INTRAVENOUS

## 2020-09-16 MED ORDER — MAGNESIUM SULFATE IN D5W 1-5 GM/100ML-% IV SOLN
1.0000 g | Freq: Once | INTRAVENOUS | Status: AC
  Administered 2020-09-16: 1 g via INTRAVENOUS
  Filled 2020-09-16: qty 100

## 2020-09-16 MED ORDER — KETOROLAC TROMETHAMINE 15 MG/ML IJ SOLN
15.0000 mg | Freq: Once | INTRAMUSCULAR | Status: AC
  Administered 2020-09-16: 15 mg via INTRAVENOUS
  Filled 2020-09-16: qty 1

## 2020-09-16 MED ORDER — DIPHENHYDRAMINE HCL 50 MG/ML IJ SOLN
25.0000 mg | Freq: Once | INTRAMUSCULAR | Status: AC
  Administered 2020-09-16: 25 mg via INTRAVENOUS
  Filled 2020-09-16: qty 1

## 2020-09-16 NOTE — Discharge Instructions (Addendum)
You can take 600 mg of ibuprofen every 6 hours, you can take 1000 mg of Tylenol every 6 hours, you can alternate these every 3 or you can take them together.  

## 2020-09-16 NOTE — ED Provider Notes (Signed)
[facility]-EMERGENCY DEPT Provider Note   CSN: [id number] Arrival date & time: 09/16/20  1149     History Chief Complaint  Patient presents with  . Dizziness    Kristy Davis is a 30 y.o. female.  Patient complains of headache nausea vomiting chills.  The headache started earlier today is gradually gotten worse.  Feels similar to previous migraines but has not had one in years.  No recent illness no recent trauma.  Vomiting is nonbloody.  Her abdomen is without pain.  She is having normal bowel movements normal urination.  She tried Tylenol it was ineffective.  No photosensitivity.  The history is provided by the patient.       Past Medical History:  Diagnosis Date  . Allergy   . Migraine   . Ovarian cyst     Patient Active Problem List   Diagnosis Date Noted  . Postpartum care following vaginal delivery 03/07/2018  . Contraception management 03/07/2018  . Migraine     Past Surgical History:  Procedure Laterality Date  . WISDOM TOOTH EXTRACTION       OB History    Gravida  1   Para  1   Term  1   Preterm  0   AB  0   Living  1     SAB  0   IAB  0   Ectopic  0   Multiple  0   Live Births  1           History reviewed. No pertinent family history.  Social History   Tobacco Use  . Smoking status: Never Smoker  . Smokeless tobacco: Never Used  Vaping Use  . Vaping Use: Never used  Substance Use Topics  . Alcohol use: Not Currently    Alcohol/week: 0.0 standard drinks    Comment: occasion  . Drug use: No    Home Medications Prior to Admission medications   Medication Sig Start Date End Date Taking? Authorizing Provider  metroNIDAZOLE (FLAGYL) 500 MG tablet Take 500 mg by mouth 2 (two) times daily. 09/08/20  Yes [provider]  norethindrone (MICRONOR,CAMILA,ERRIN) 0.35 MG tablet Take 1 tablet (0.35 mg total) by mouth daily. Patient not taking: Reported on 09/16/2020 03/07/18   Hermina Staggers, MD     Allergies    Patient has no known allergies.  Review of Systems   Review of Systems  Constitutional: Positive for chills. Negative for fever.  HENT: Negative for congestion and rhinorrhea.   Respiratory: Negative for cough and shortness of breath.   Cardiovascular: Negative for chest pain and palpitations.  Gastrointestinal: Positive for nausea and vomiting. Negative for diarrhea.  Genitourinary: Negative for difficulty urinating and dysuria.  Musculoskeletal: Negative for arthralgias and back pain.  Skin: Negative for rash and wound.  Neurological: Positive for headaches. Negative for light-headedness.    Physical Exam Updated Vital Signs BP 122/70   Pulse 79   Temp 97.8 F (36.6 C) (Oral)   Resp 19   Ht 5\' 8"  (1.727 m)   Wt 63.5 kg   LMP 09/09/2020   SpO2 99%   BMI 21.29 kg/m   Physical Exam Vitals and nursing note reviewed. Exam conducted with a chaperone present.  Constitutional:      General: She is not in acute distress.    Appearance: Normal appearance.  HENT:     Head: Normocephalic and atraumatic.     Nose: No rhinorrhea.  Eyes:  General:        Right eye: No discharge.        Left eye: No discharge.     Conjunctiva/sclera: Conjunctivae normal.     Pupils: Pupils are equal, round, and reactive to light.  Cardiovascular:     Rate and Rhythm: Normal rate and regular rhythm.  Pulmonary:     Effort: Pulmonary effort is normal. No respiratory distress.     Breath sounds: No stridor.  Abdominal:     General: Abdomen is flat. There is no distension.     Palpations: Abdomen is soft.  Musculoskeletal:        General: No tenderness or signs of injury.  Skin:    General: Skin is warm and dry.  Neurological:     General: No focal deficit present.     Mental Status: She is alert. Mental status is at baseline.     Motor: No weakness.     Comments: 5 out of 5 motor strength in all extremities, sensation intact throughout, no dysmetria, no  dysdiadochokinesia, no ataxia with ambulation, cranial nerves II through XII intact, alert and oriented to person place and time   Psychiatric:        Mood and Affect: Mood normal.        Behavior: Behavior normal.     ED Results / Procedures / Treatments   Labs (all labs ordered are listed, but only abnormal results are displayed) Labs Reviewed  BASIC METABOLIC PANEL - Abnormal; Notable for the following components:      Result Value   CO2 20 (*)    Glucose, Bld 140 (*)    All other components within normal limits  CBC WITH DIFFERENTIAL/PLATELET - Abnormal; Notable for the following components:   Neutro Abs 8.9 (*)    All other components within normal limits  SARS CORONAVIRUS 2 (TAT 6-24 HRS)  I-STAT BETA HCG BLOOD, ED ([facility], [facility], [facility] ONLY)    EKG None  Radiology No results found.  Procedures Procedures   Medications Ordered in ED Medications  ketorolac (TORADOL) 15 MG/ML injection 15 mg (15 mg Intravenous Given 09/16/20 1445)  metoCLOPramide (REGLAN) injection 10 mg (10 mg Intravenous Given 09/16/20 1446)  diphenhydrAMINE (BENADRYL) injection 25 mg (25 mg Intravenous Given 09/16/20 1445)  lactated ringers bolus 1,000 mL (0 mLs Intravenous Stopped 09/16/20 1630)  magnesium sulfate IVPB 1 g 100 mL (0 g Intravenous Stopped 09/16/20 1913)    ED Course  I have reviewed the triage vital signs and the nursing notes.  Pertinent labs & imaging results that were available during my care of the patient were reviewed by me and considered in my medical decision making (see chart for details).    MDM Rules/Calculators/A&P                          Migraine versus flulike illness.  Labs reassuring thus far.  Waiting for symptom control.  Laboratory studies unremarkable.  No focal neurologic deficit normal mental status.  Laboratory studies fairly unremarkable.  Patient's symptoms improved after magnesium was added to the headache therapy.  She is not 100% but feels better enough to go home.   She will be discharged.  Return precautions Final Clinical Impression(s) / ED Diagnoses Final diagnoses:  Acute nonintractable headache, unspecified headache type    Rx / DC Orders ED Discharge Orders    None       Sabino Donovan, MD 09/16/20 [phone]

## 2020-09-16 NOTE — ED Triage Notes (Signed)
Patient c/o dizziness today. Occurred when trying to stand up from a chair. patient also c/o headache and emesis after having dizziness today.  Patient also c/o light sensitivity.

## 2020-09-16 NOTE — ED Provider Notes (Signed)
Emergency Medicine Provider Triage Evaluation Note  Kristy Davis , a 30 y.o. female  was evaluated in triage.  Pt complains of weakness.  Review of Systems  Positive: Headache, chills, shaky, nausea, vomiting Negative: Sore throat, cough, dysuria  Physical Exam  BP 122/71 (BP Location: Right Arm)   Pulse 84   Temp 97.8 F (36.6 C) (Oral)   Resp 17   SpO2 100%  Gen:   Awake, no distress Resp:  Normal effort  MSK:   Moves extremities without difficulty  Other:    Medical Decision Making  Medically screening exam initiated at 12:09 PM.  Appropriate orders placed.  KALENE CUTLER was informed that the remainder of the evaluation will be completed by another provider, this initial triage assessment does not replace that evaluation, and the importance of remaining in the ED until their evaluation is complete.  Pt with cold sxs started today.  Have not been vaccinated for covid.     Fayrene Helper, PA-C 09/16/20 1210    Derwood Kaplan, MD 09/17/20 [phone]

## 2022-02-18 ENCOUNTER — Encounter: Payer: Self-pay | Admitting: Emergency Medicine

## 2022-02-18 ENCOUNTER — Ambulatory Visit
Admission: EM | Admit: 2022-02-18 | Discharge: 2022-02-18 | Disposition: A | Discharge status: Home / Self Care | Payer: Self-pay | Attending: Internal Medicine | Admitting: Internal Medicine

## 2022-02-18 DIAGNOSIS — Z113 Encounter for screening for infections with a predominantly sexual mode of transmission: Secondary | ICD-10-CM

## 2022-02-18 DIAGNOSIS — Z3202 Encounter for pregnancy test, result negative: Secondary | ICD-10-CM

## 2022-02-18 DIAGNOSIS — N898 Other specified noninflammatory disorders of vagina: Secondary | ICD-10-CM

## 2022-02-18 LAB — POCT URINE PREGNANCY: Preg Test, Ur: NEGATIVE

## 2022-02-18 MED ORDER — FLUCONAZOLE 150 MG PO TABS: 150.0000 mg | ORAL_TABLET | ORAL | 0 refills | Status: DC

## 2022-02-18 NOTE — ED Provider Notes (Signed)
[facility]    CSN: [id number] Arrival date & time: 02/18/22  1125      History   Chief Complaint Chief Complaint  Patient presents with   Vaginal Discharge   Vaginal Itching    HPI Kristy Davis is a 31 y.o. female.   Patient presents with vaginal discharge, vaginal irritation, vaginal itching that has been present for about 3 days.  Denies dysuria, urinary burning, abdominal pain, hematuria, abnormal vaginal bleeding, back pain, fever.  Denies any confirmed exposure to STD but has had unprotected sexual intercourse prior to symptoms starting.  Patient reports that she has history of recurrent bacterial vaginosis and vaginal yeast.  She states that this feels similar to vaginal yeast infections in the past.  Last menstrual cycle was at the end of October.   Vaginal Discharge Associated symptoms: vaginal itching   Vaginal Itching    Past Medical History:  Diagnosis Date   Allergy    Migraine    Ovarian cyst     Patient Active Problem List   Diagnosis Date Noted   Postpartum care following vaginal delivery 03/07/2018   Contraception management 03/07/2018   Migraine     Past Surgical History:  Procedure Laterality Date   WISDOM TOOTH EXTRACTION      OB History     Gravida  1   Para  1   Term  1   Preterm  0   AB  0   Living  1      SAB  0   IAB  0   Ectopic  0   Multiple  0   Live Births  1            Home Medications    Prior to Admission medications   Medication Sig Start Date End Date Taking? Authorizing Provider  fluconazole (DIFLUCAN) 150 MG tablet Take 1 tablet (150 mg total) by mouth every 3 (three) days. Take first pill today.  May take second pill in 3 days if no resolution of symptoms. 02/18/22  Yes Loreto Loescher, Rolly Salter E, FNP  metroNIDAZOLE (FLAGYL) 500 MG tablet Take 500 mg by mouth 2 (two) times daily. 09/08/20   [provider]  norethindrone (MICRONOR,CAMILA,ERRIN) 0.35 MG tablet Take 1 tablet (0.35 mg  total) by mouth daily. Patient not taking: Reported on 09/16/2020 03/07/18   Hermina Staggers, MD    Family History History reviewed. No pertinent family history.  Social History Social History   Tobacco Use   Smoking status: Never   Smokeless tobacco: Never  Vaping Use   Vaping Use: Never used  Substance Use Topics   Alcohol use: Not Currently    Alcohol/week: 0.0 standard drinks of alcohol    Comment: occasion   Drug use: No     Allergies   Patient has no known allergies.   Review of Systems Review of Systems Per HPI  Physical Exam Triage Vital Signs ED Triage Vitals  Enc Vitals Group     BP 02/18/22 1302 115/69     Pulse Rate 02/18/22 1302 79     Resp 02/18/22 1302 18     Temp 02/18/22 1302 98 F (36.7 C)     Temp src --      SpO2 02/18/22 1302 99 %     Weight --      Height --      Head Circumference --      Peak Flow --      Pain Score  02/18/22 1301 0     Pain Loc --      Pain Edu? --      Excl. in GC? --    No data found.  Updated Vital Signs BP 115/69   Pulse 79   Temp 98 F (36.7 C)   Resp 18   SpO2 99%   Breastfeeding No   Visual Acuity Right Eye Distance:   Left Eye Distance:   Bilateral Distance:    Right Eye Near:   Left Eye Near:    Bilateral Near:     Physical Exam Constitutional:      General: She is not in acute distress.    Appearance: Normal appearance. She is not toxic-appearing or diaphoretic.  HENT:     Head: Normocephalic and atraumatic.  Eyes:     Extraocular Movements: Extraocular movements intact.     Conjunctiva/sclera: Conjunctivae normal.  Pulmonary:     Effort: Pulmonary effort is normal.  Genitourinary:    Comments: Deferred with shared decision-making.  Self swab performed. Neurological:     General: No focal deficit present.     Mental Status: She is alert and oriented to person, place, and time. Mental status is at baseline.  Psychiatric:        Mood and Affect: Mood normal.        Behavior:  Behavior normal.        Thought Content: Thought content normal.        Judgment: Judgment normal.      UC Treatments / Results  Labs (all labs ordered are listed, but only abnormal results are displayed) Labs Reviewed  POCT URINE PREGNANCY  CERVICOVAGINAL ANCILLARY ONLY    EKG   Radiology No results found.  Procedures Procedures (including critical care time)  Medications Ordered in UC Medications - No data to display  Initial Impression / Assessment and Plan / UC Course  I have reviewed the triage vital signs and the nursing notes.  Pertinent labs & imaging results that were available during my care of the patient were reviewed by me and considered in my medical decision making (see chart for details).     Suspicious of vaginal yeast infection given the patient has had this in the past and reports that it "feels similar".  Will opt to treat with Diflucan.  Cervicovaginal swab pending.  Will await results for any further treatment.  Urine pregnancy test was negative.  UA deferred given no associated urinary symptoms.  Patient advised to follow-up if symptoms persist or worsen.  Patient also advised to refrain from sexual activity until test results and treatment are complete.  Patient verbalized understanding and was agreeable with plan. Final Clinical Impressions(s) / UC Diagnoses   Final diagnoses:  Vaginal discharge  Screening examination for venereal disease  Urine pregnancy test negative     Discharge Instructions      Your vaginal swab is pending. I am treating you for vaginal yeast infection given suspicion with Diflucan.  Swab is pending.  We will call if it is abnormal.  Please refrain from sexual activity until test results and treatment are complete.  Urine pregnancy test was negative.    ED Prescriptions     Medication Sig Dispense Auth. Provider   fluconazole (DIFLUCAN) 150 MG tablet Take 1 tablet (150 mg total) by mouth every 3 (three) days. Take  first pill today.  May take second pill in 3 days if no resolution of symptoms. 2 tablet [place], Acie Fredrickson, [place]  PDMP not reviewed this encounter.   Teodora Medici, [place] 02/18/22 1336

## 2022-02-18 NOTE — Discharge Instructions (Signed)
Your vaginal swab is pending. I am treating you for vaginal yeast infection given suspicion with Diflucan.  Swab is pending.  We will call if it is abnormal.  Please refrain from sexual activity until test results and treatment are complete.  Urine pregnancy test was negative.

## 2022-02-18 NOTE — ED Triage Notes (Signed)
Pt is present today with c/o vaginal discharge and irritation x3 days

## 2022-02-20 LAB — CERVICOVAGINAL ANCILLARY ONLY
Bacterial Vaginitis (gardnerella): NEGATIVE
Candida Glabrata: NEGATIVE
Candida Vaginitis: POSITIVE — AB
Chlamydia: NEGATIVE
Comment: NEGATIVE
Comment: NEGATIVE
Comment: NEGATIVE
Comment: NEGATIVE
Comment: NEGATIVE
Comment: NORMAL
Neisseria Gonorrhea: NEGATIVE
Trichomonas: NEGATIVE

## 2022-04-28 ENCOUNTER — Ambulatory Visit
Admission: EM | Admit: 2022-04-28 | Discharge: 2022-04-28 | Disposition: A | Discharge status: Home / Self Care | Payer: Medicaid Other | Attending: Urgent Care | Admitting: Urgent Care

## 2022-04-28 DIAGNOSIS — Z20828 Contact with and (suspected) exposure to other viral communicable diseases: Secondary | ICD-10-CM

## 2022-04-28 DIAGNOSIS — J111 Influenza due to unidentified influenza virus with other respiratory manifestations: Secondary | ICD-10-CM

## 2022-04-28 MED ORDER — CETIRIZINE HCL 10 MG PO TABS: 10.0000 mg | ORAL_TABLET | Freq: Every day | ORAL | 0 refills | Status: AC

## 2022-04-28 MED ORDER — PSEUDOEPHEDRINE HCL 30 MG PO TABS: 30.0000 mg | ORAL_TABLET | Freq: Three times a day (TID) | ORAL | 0 refills | Status: DC | PRN

## 2022-04-28 MED ORDER — BENZONATATE 100 MG PO CAPS: 100.0000 mg | ORAL_CAPSULE | Freq: Three times a day (TID) | ORAL | 0 refills | Status: DC | PRN

## 2022-04-28 MED ORDER — PROMETHAZINE-DM 6.25-15 MG/5ML PO SYRP: 2.5000 mL | ORAL_SOLUTION | Freq: Three times a day (TID) | ORAL | 0 refills | Status: DC | PRN

## 2022-04-28 MED ORDER — OSELTAMIVIR PHOSPHATE 75 MG PO CAPS: 75.0000 mg | ORAL_CAPSULE | Freq: Two times a day (BID) | ORAL | 0 refills | Status: DC

## 2022-04-28 NOTE — ED Triage Notes (Signed)
Pt presents to uc with co of otalgia, phlegm cough congestion and fevers. Pt reports she thinks she got the flu from her son who had it last week. Pt reports musinex cold and flu otc

## 2022-04-28 NOTE — ED Provider Notes (Signed)
[facility]  Note:  This document was prepared using Dragon voice recognition software and may include unintentional dictation errors.  MRN: [id number] DOB: [birth year]  Subjective:   Kristy Davis is a 32 y.o. female presenting for 2-3 day history of acute onset body aches, bilateral ear pain, coughing, congestion, fevers.  Her son tested her for influenza B.  She has been taking care of him this whole week.  Has been using over-the-counter cold medication, Mucinex.  No chest pain, shortness of breath or wheezing.  She has a history of allergies but does not take anything consistently for this.  No history of asthma.  No smoking, vaping, marijuana use.  No current facility-administered medications for this encounter.  Current Outpatient Medications:    fluconazole (DIFLUCAN) 150 MG tablet, Take 1 tablet (150 mg total) by mouth every 3 (three) days. Take first pill today.  May take second pill in 3 days if no resolution of symptoms. (Patient not taking: Reported on 04/28/2022), Disp: 2 tablet, Rfl: 0   metroNIDAZOLE (FLAGYL) 500 MG tablet, Take 500 mg by mouth 2 (two) times daily., Disp: , Rfl:    norethindrone (MICRONOR,CAMILA,ERRIN) 0.35 MG tablet, Take 1 tablet (0.35 mg total) by mouth daily. (Patient not taking: Reported on 09/16/2020), Disp: 1 Package, Rfl: 11   No Known Allergies  Past Medical History:  Diagnosis Date   Allergy    Migraine    Ovarian cyst      Past Surgical History:  Procedure Laterality Date   WISDOM TOOTH EXTRACTION      History reviewed. No pertinent family history.  Social History   Tobacco Use   Smoking status: Never   Smokeless tobacco: Never  Vaping Use   Vaping Use: Never used  Substance Use Topics   Alcohol use: Not Currently    Alcohol/week: 0.0 standard drinks of alcohol    Comment: occasion   Drug use: No    ROS   Objective:   Vitals: BP 113/77   Pulse 96   Temp 98.2 F (36.8 C)   Resp 19   LMP 04/20/2022    SpO2 98%   Physical Exam Constitutional:      General: She is not in acute distress.    Appearance: Normal appearance. She is well-developed and normal weight. She is not ill-appearing, toxic-appearing or diaphoretic.  HENT:     Head: Normocephalic and atraumatic.     Right Ear: Tympanic membrane, ear canal and external ear normal. No drainage or tenderness. No middle ear effusion. There is no impacted cerumen. Tympanic membrane is not erythematous or bulging.     Left Ear: Tympanic membrane, ear canal and external ear normal. No drainage or tenderness.  No middle ear effusion. There is no impacted cerumen. Tympanic membrane is not erythematous or bulging.     Nose: Congestion present. No rhinorrhea.     Mouth/Throat:     Mouth: Mucous membranes are moist. No oral lesions.     Pharynx: No pharyngeal swelling, oropharyngeal exudate, posterior oropharyngeal erythema or uvula swelling.     Tonsils: No tonsillar exudate or tonsillar abscesses.  Eyes:     General: No scleral icterus.       Right eye: No discharge.        Left eye: No discharge.     Extraocular Movements: Extraocular movements intact.     Right eye: Normal extraocular motion.     Left eye: Normal extraocular motion.     Conjunctiva/sclera: Conjunctivae normal.  Cardiovascular:     Rate and Rhythm: Normal rate and regular rhythm.     Heart sounds: Normal heart sounds. No murmur heard.    No friction rub. No gallop.  Pulmonary:     Effort: Pulmonary effort is normal. No respiratory distress.     Breath sounds: No stridor. No wheezing, rhonchi or rales.  Chest:     Chest wall: No tenderness.  Musculoskeletal:     Cervical back: Normal range of motion and neck supple.  Lymphadenopathy:     Cervical: No cervical adenopathy.  Skin:    General: Skin is warm and dry.  Neurological:     General: No focal deficit present.     Mental Status: She is alert and oriented to person, place, and time.  Psychiatric:        Mood and  Affect: Mood normal.        Behavior: Behavior normal.     Assessment and Plan :   PDMP not reviewed this encounter.  1. Influenza   2. Exposure to influenza     Will cover for influenza with Tamiflu given exposure, symptom set, current incidence in the community.  Use supportive care, rest, fluids, hydration, light meals, schedule Tylenol and ibuprofen. Deferred imaging given clear cardiopulmonary exam, hemodynamically stable vital signs. Counseled patient on potential for adverse effects with medications prescribed today, patient verbalized understanding. ER and return-to-clinic precautions discussed, patient verbalized understanding.    Jaynee Eagles, PA-C 04/28/22 1013

## 2022-08-11 ENCOUNTER — Emergency Department (HOSPITAL_BASED_OUTPATIENT_CLINIC_OR_DEPARTMENT_OTHER)
Admission: EM | Admit: 2022-08-11 | Discharge: 2022-08-12 | Disposition: A | Discharge status: Home / Self Care | Payer: Medicaid Other | Attending: Emergency Medicine | Admitting: Emergency Medicine

## 2022-08-11 ENCOUNTER — Other Ambulatory Visit: Payer: Self-pay

## 2022-08-11 ENCOUNTER — Encounter (HOSPITAL_BASED_OUTPATIENT_CLINIC_OR_DEPARTMENT_OTHER): Payer: Self-pay | Admitting: *Deleted

## 2022-08-11 DIAGNOSIS — R1031 Right lower quadrant pain: Secondary | ICD-10-CM | POA: Insufficient documentation

## 2022-08-11 LAB — CBC
HCT: 36.3 % (ref 36.0–46.0)
Hemoglobin: 11.8 g/dL — ABNORMAL LOW (ref 12.0–15.0)
MCH: 29.5 pg (ref 26.0–34.0)
MCHC: 32.5 g/dL (ref 30.0–36.0)
MCV: 90.8 fL (ref 80.0–100.0)
Platelets: 215 10*3/uL (ref 150–400)
RBC: 4 MIL/uL (ref 3.87–5.11)
RDW: 12.4 % (ref 11.5–15.5)
WBC: 8 10*3/uL (ref 4.0–10.5)
nRBC: 0 % (ref 0.0–0.2)

## 2022-08-11 LAB — LIPASE, BLOOD: Lipase: 60 U/L — ABNORMAL HIGH (ref 11–51)

## 2022-08-11 LAB — URINALYSIS, ROUTINE W REFLEX MICROSCOPIC
Bilirubin Urine: NEGATIVE
Glucose, UA: NEGATIVE mg/dL
Ketones, ur: NEGATIVE mg/dL
Nitrite: NEGATIVE
Protein, ur: NEGATIVE mg/dL
Specific Gravity, Urine: 1.025 (ref 1.005–1.030)
pH: 6 (ref 5.0–8.0)

## 2022-08-11 LAB — COMPREHENSIVE METABOLIC PANEL
ALT: 15 U/L (ref 0–44)
AST: 20 U/L (ref 15–41)
Albumin: 4 g/dL (ref 3.5–5.0)
Alkaline Phosphatase: 51 U/L (ref 38–126)
Anion gap: 6 (ref 5–15)
BUN: 12 mg/dL (ref 6–20)
CO2: 25 mmol/L (ref 22–32)
Calcium: 8.7 mg/dL — ABNORMAL LOW (ref 8.9–10.3)
Chloride: 102 mmol/L (ref 98–111)
Creatinine, Ser: 0.76 mg/dL (ref 0.44–1.00)
GFR, Estimated: >60 mL/min (ref >60)
Glucose, Bld: 83 mg/dL (ref 70–99)
Potassium: 3.4 mmol/L — ABNORMAL LOW (ref 3.5–5.1)
Sodium: 133 mmol/L — ABNORMAL LOW (ref 135–145)
Total Bilirubin: 0.5 mg/dL (ref 0.3–1.2)
Total Protein: 7.4 g/dL (ref 6.5–8.1)

## 2022-08-11 LAB — PREGNANCY, URINE: Preg Test, Ur: NEGATIVE

## 2022-08-11 LAB — URINALYSIS, MICROSCOPIC (REFLEX)

## 2022-08-11 MED ORDER — IBUPROFEN 800 MG PO TABS
800.0000 mg | ORAL_TABLET | Freq: Once | ORAL | Status: AC
  Administered 2022-08-11: 800 mg via ORAL
  Filled 2022-08-11: qty 1

## 2022-08-11 MED ORDER — IBUPROFEN 800 MG PO TABS: 800.0000 mg | ORAL_TABLET | Freq: Three times a day (TID) | ORAL | 0 refills | Status: DC

## 2022-08-11 NOTE — ED Provider Notes (Signed)
[facility] Provider Note   CSN: [id number] Arrival date & time: 08/11/22  2006     History  Chief Complaint  Patient presents with   Abdominal Pain    Kristy Davis is a 32 y.o. female.  Patient with history of ovarian cyst presents today with complaints of right lower quadrant abdominal pain.  She states that same began Wednesday morning and has been persistent since then.  Pain is localized to the right lower quadrant and has not moved since onset.  She denies any associated nausea, vomiting, diarrhea.  No urinary symptoms or vaginal discharge.  Her last menstrual cycle was at the beginning of April and was normal for her.  She states that her pain feels exactly like ovarian cyst ruptures that she has had many times previously.  She states that she normally comes in and gets a ultrasound to confirm that she has a rupture and then receives a prescription for ibuprofen 800 and her pain resolves on its own.  She did see her OB/GYN on Monday and had a normal Pap smear.  She denies any history of abdominal surgeries.  No fevers or chills.  The history is provided by the patient. No language interpreter was used.  Abdominal Pain      Home Medications Prior to Admission medications   Medication Sig Start Date End Date Taking? Authorizing Provider  benzonatate (TESSALON) 100 MG capsule Take 1 capsule (100 mg total) by mouth 3 (three) times daily as needed for cough. 04/28/22   Wallis Bamberg, PA-C  cetirizine (ZYRTEC ALLERGY) 10 MG tablet Take 1 tablet (10 mg total) by mouth daily. 04/28/22   Wallis Bamberg, PA-C  fluconazole (DIFLUCAN) 150 MG tablet Take 1 tablet (150 mg total) by mouth every 3 (three) days. Take first pill today.  May take second pill in 3 days if no resolution of symptoms. Patient not taking: Reported on 04/28/2022 02/18/22   Gustavus Bryant, FNP  metroNIDAZOLE (FLAGYL) 500 MG tablet Take 500 mg by mouth 2 (two) times daily.  09/08/20   [provider]  norethindrone (MICRONOR,CAMILA,ERRIN) 0.35 MG tablet Take 1 tablet (0.35 mg total) by mouth daily. Patient not taking: Reported on 09/16/2020 03/07/18   Hermina Staggers, MD  oseltamivir (TAMIFLU) 75 MG capsule Take 1 capsule (75 mg total) by mouth 2 (two) times daily. 04/28/22   Wallis Bamberg, PA-C  promethazine-dextromethorphan (PROMETHAZINE-DM) 6.25-15 MG/5ML syrup Take 2.5 mLs by mouth 3 (three) times daily as needed for cough. 04/28/22   Wallis Bamberg, PA-C  pseudoephedrine (SUDAFED) 30 MG tablet Take 1 tablet (30 mg total) by mouth every 8 (eight) hours as needed for congestion. 04/28/22   Wallis Bamberg, PA-C      Allergies    Patient has no known allergies.    Review of Systems   Review of Systems  Gastrointestinal:  Positive for abdominal pain.  All other systems reviewed and are negative.   Physical Exam Updated Vital Signs BP 113/66 (BP Location: Right Arm)   Pulse 79   Temp 98.4 F (36.9 C) (Oral)   Resp 18   SpO2 100%  Physical Exam Vitals and nursing note reviewed.  Constitutional:      General: She is not in acute distress.    Appearance: Normal appearance. She is normal weight. She is not ill-appearing, toxic-appearing or diaphoretic.  HENT:     Head: Normocephalic and atraumatic.  Cardiovascular:     Rate and Rhythm: Normal  rate.  Pulmonary:     Effort: Pulmonary effort is normal. No respiratory distress.  Abdominal:     General: Abdomen is flat.     Palpations: Abdomen is soft.     Tenderness: There is abdominal tenderness in the right lower quadrant. There is no guarding or rebound. Negative signs include McBurney's sign.  Musculoskeletal:        General: Normal range of motion.     Cervical back: Normal range of motion.  Skin:    General: Skin is warm and dry.  Neurological:     General: No focal deficit present.     Mental Status: She is alert.  Psychiatric:        Mood and Affect: Mood normal.        Behavior: Behavior  normal.     ED Results / Procedures / Treatments   Labs (all labs ordered are listed, but only abnormal results are displayed) Labs Reviewed  LIPASE, BLOOD - Abnormal; Notable for the following components:      Result Value   Lipase 60 (*)    All other components within normal limits  COMPREHENSIVE METABOLIC PANEL - Abnormal; Notable for the following components:   Sodium 133 (*)    Potassium 3.4 (*)    Calcium 8.7 (*)    All other components within normal limits  CBC - Abnormal; Notable for the following components:   Hemoglobin 11.8 (*)    All other components within normal limits  URINALYSIS, ROUTINE W REFLEX MICROSCOPIC - Abnormal; Notable for the following components:   Hgb urine dipstick MODERATE (*)    Leukocytes,Ua TRACE (*)    All other components within normal limits  URINALYSIS, MICROSCOPIC (REFLEX) - Abnormal; Notable for the following components:   Bacteria, UA RARE (*)    All other components within normal limits  PREGNANCY, URINE    EKG None  Radiology No results found.  Procedures Procedures    Medications Ordered in ED Medications  ibuprofen (ADVIL) tablet 800 mg (800 mg Oral Given 08/11/22 2345)    ED Course/ Medical Decision Making/ A&P                             Medical Decision Making Amount and/or Complexity of Data Reviewed Labs: ordered.  Risk Prescription drug management.   This patient is a 32 y.o. female who presents to the ED for concern of abdominal pain, this involves an extensive number of treatment options, and is a complaint that carries with it a high risk of complications and morbidity. The emergent differential diagnosis prior to evaluation includes, but is not limited to, gastroenteritis, appendicitis, Bowel obstruction, Bowel perforation. Gastroparesis, DKA, Hernia, Inflammatory bowel disease, mesenteric ischemia, pancreatitis, peritonitis SBP, volvulus.  This is not an exhaustive differential.   Past Medical History /  Co-morbidities / Social History: history of ovarian cyst   Additional history: Chart reviewed. Pertinent results include: several previous pelvic ultrasounds that show ovarian cysts  Physical Exam: Physical exam performed. The pertinent findings include: RLQ TTP  Lab Tests: I ordered, and personally interpreted labs.  The pertinent results include:  Na 133, K 3.4, UA with some hematuria, noninfectious.   Medications: I ordered medication including ibuprofen 800 mg  for pain. Reevaluation of the patient after these medicines showed that the patient improved. I have reviewed the patients home medicines and have made adjustments as needed.    Disposition:  Patient presents today with  complaints of RLQ abdominal pain x 2 days.  She is afebrile, nontoxic-appearing, and in no acute distress with reassuring vital signs.  Laboratory evaluation overall benign.  Patient states her symptoms feel exactly like when she has had ovarian cyst ruptures previously.  I did discuss that the right lower quadrant is also where you have pain with appendicitis.  I did offer CT imaging to further evaluate this, however patient declined this imaging. Lower suspicion for appendicitis given duration of symptoms with no leukocytosis, nausea, vomiting, or diarrhea.  She states that she only wants a pelvic ultrasound to see if her cyst is ruptured.  Unfortunately we do not have ultrasound available this evening.  I have discussed that an ovarian cyst rupture does not necessarily warrant imaging.  Did discuss the concern for potential ovarian torsion with ovarian cysts and pain in this location, however given that her pain is mild in nature and has been ongoing for the past 2 days, lower suspicion for ovarian torsion.  I did offer transfer for ultrasound imaging which patient declined.  She states that she would prefer to just go home and watch her symptoms and return if anything gets worse. Evaluation and diagnostic testing in  the emergency department does not suggest an emergent condition requiring admission or immediate intervention beyond what has been performed at this time.  Plan for discharge with ibuprofen 800 rx and close PCP follow-up.  Patient is understanding and amenable with plan, educated on red flag symptoms that would prompt immediate return.  Patient discharged in stable condition.   I discussed this case with my attending physician Dr. Renaye Rakers who cosigned this note including patient's presenting symptoms, physical exam, and planned diagnostics and interventions. Attending physician stated agreement with plan or made changes to plan which were implemented.     Final Clinical Impression(s) / ED Diagnoses Final diagnoses:  RLQ abdominal pain    Rx / DC Orders ED Discharge Orders          Ordered    ibuprofen (ADVIL) 800 MG tablet  3 times daily        08/11/22 2356          An After Visit Summary was printed and given to the patient.     [facility] Clock 08/11/22 2358    Terald Sleeper, MD 08/12/22 [phone]

## 2022-08-11 NOTE — Discharge Instructions (Signed)
As we discussed, laboratory evaluation of your symptoms did not reveal any emergent concerns.  You have chosen to defer additional workup with CT imaging at this time.  We unfortunately do not have ultrasound available this evening.  I have given you a prescription for ibuprofen for you to take as prescribed as needed for management of your pain.  I recommend that you rest and use a heating pad for comfort and return for additional workup in the morning if your pain is not improved.  Follow-up with your primary care doctor.   Return if development of any new or worsening symptoms.

## 2022-08-11 NOTE — ED Triage Notes (Signed)
RLQ pain since Wednesday.  Pt has hx of ovarian cysts and feels like this is what it is.  No n/v with this, no fever.

## 2022-12-01 ENCOUNTER — Ambulatory Visit: Payer: Self-pay

## 2022-12-01 NOTE — Telephone Encounter (Signed)
Chief Complaint: Medication question   Disposition: [] ED /[] Urgent Care (no appt availability in office) / [] Appointment(In office/virtual)/ []  [facility]/ [] Home Care/ [] Refused Recommended Disposition /[] [facility]/ [x]  Follow-up with PCP Additional Notes: Patient stated that she accidentally took Ibuprofen 800mg  and she is [redacted] weeks pregnant. Patient just wanted to make sure she would be okay. Advised patient that she should avoid taking Ibuprofen during the pregnancy due to fetal risk per [facility]. Patient denies abdominal cramping and bleeding at this time. Advised patient to follow-up with OB/GYN. Patient verbalized understanding.  Reason for Disposition  Caller has medicine question only, adult not sick, AND triager answers question  Answer Assessment - Initial Assessment Questions 1. NAME of MEDICINE: "What medicine(s) are you calling about?"     Ibuprofen 800mg   2. QUESTION: "What is your question?" (e.g., double dose of medicine, side effect)     I made a mistake and took 800mg  of Ibuprofen, am I going to be okay? 3. PRESCRIBER: "Who prescribed the medicine?" Reason: if prescribed by specialist, call should be referred to that group.     PCP 4. SYMPTOMS: "Do you have any symptoms?" If Yes, ask: "What symptoms are you having?"  "How bad are the symptoms (e.g., mild, moderate, severe)     None 5. PREGNANCY:  "Is there any chance that you are pregnant?" "When was your last menstrual period?"     Yes, I am 5 weeks pregnancy  Protocols used: Medication Question Call-A-AH

## 2022-12-19 LAB — OB RESULTS CONSOLE HEPATITIS B SURFACE ANTIGEN: Hepatitis B Surface Ag: NEGATIVE

## 2022-12-19 LAB — OB RESULTS CONSOLE GC/CHLAMYDIA
Chlamydia: NEGATIVE
Neisseria Gonorrhea: NEGATIVE

## 2022-12-19 LAB — OB RESULTS CONSOLE RUBELLA ANTIBODY, IGM: Rubella: IMMUNE

## 2022-12-19 LAB — OB RESULTS CONSOLE HIV ANTIBODY (ROUTINE TESTING): HIV: NONREACTIVE

## 2022-12-19 LAB — HEPATITIS C ANTIBODY: HCV Ab: NEGATIVE

## 2023-02-03 ENCOUNTER — Inpatient Hospital Stay (HOSPITAL_COMMUNITY)
Admission: AD | Admit: 2023-02-03 | Discharge: 2023-02-03 | Disposition: A | Discharge status: Home / Self Care | Payer: Medicaid Other | Attending: Obstetrics and Gynecology | Admitting: Obstetrics and Gynecology

## 2023-02-03 ENCOUNTER — Encounter (HOSPITAL_COMMUNITY): Payer: Self-pay | Admitting: *Deleted

## 2023-02-03 DIAGNOSIS — N949 Unspecified condition associated with female genital organs and menstrual cycle: Secondary | ICD-10-CM | POA: Diagnosis present

## 2023-02-03 DIAGNOSIS — R102 Pelvic and perineal pain: Secondary | ICD-10-CM | POA: Insufficient documentation

## 2023-02-03 DIAGNOSIS — O26892 Other specified pregnancy related conditions, second trimester: Secondary | ICD-10-CM | POA: Insufficient documentation

## 2023-02-03 DIAGNOSIS — H538 Other visual disturbances: Secondary | ICD-10-CM | POA: Diagnosis not present

## 2023-02-03 DIAGNOSIS — Z3A14 14 weeks gestation of pregnancy: Secondary | ICD-10-CM | POA: Insufficient documentation

## 2023-02-03 DIAGNOSIS — R519 Headache, unspecified: Secondary | ICD-10-CM | POA: Diagnosis present

## 2023-02-03 LAB — URINALYSIS, ROUTINE W REFLEX MICROSCOPIC
Bilirubin Urine: NEGATIVE
Glucose, UA: NEGATIVE mg/dL
Ketones, ur: NEGATIVE mg/dL
Leukocytes,Ua: NEGATIVE
Nitrite: NEGATIVE
Protein, ur: NEGATIVE mg/dL
Specific Gravity, Urine: 1.018 (ref 1.005–1.030)
pH: 6 (ref 5.0–8.0)

## 2023-02-03 MED ORDER — ACETAMINOPHEN-CAFFEINE 500-65 MG PO TABS
2.0000 | ORAL_TABLET | Freq: Once | ORAL | Status: AC
  Administered 2023-02-03: 2 via ORAL
  Filled 2023-02-03: qty 2

## 2023-02-03 NOTE — Discharge Instructions (Signed)

## 2023-02-03 NOTE — MAU Provider Note (Signed)
History     CSN: [id number]  Arrival date and time: 02/03/23 1358   Event Date/Time   First Provider Initiated Contact with Patient 02/03/23 1452      Chief Complaint  Patient presents with   Headache   Pelvic Pain   Kristy Davis , a  32 y.o. G2P1001 at [redacted]w[redacted]d presents to MAU with complaints of an on going headache for the last 3 days, 1 episode of blurred vision and pelvic pain. She reports a waxing and waning headache that was unrelieved with PO tylenol 2 days ago. She reports that it is across the front of the forehead and behind her eyes. She states that he has had some migraines in the past but this is not at bad as that. She states that her headache is currently a 5/10. She also reports 1 episode of blurred vision yesterday, that lasted about 4-5 mins but states that she did not have a headache at that time. She also reports increased "sharp" intermittent pelvic pain with waling and changing position. She reports that if she holds her abdomen while going upstairs the pain is relieved. She denies wearing a maternity support belt. Rates pain a 7/10 and denies attempting to relieve symptoms. She denies abnormal vaginal discharge, vaginal bleeding or urinary symptoms.         OB History     Gravida  2   Para  1   Term  1   Preterm  0   AB  0   Living  1      SAB  0   IAB  0   Ectopic  0   Multiple  0   Live Births  1           Past Medical History:  Diagnosis Date   Allergy    Migraine    Ovarian cyst     Past Surgical History:  Procedure Laterality Date   WISDOM TOOTH EXTRACTION      No family history on file.  Social History   Tobacco Use   Smoking status: Never   Smokeless tobacco: Never  Vaping Use   Vaping status: Never Used  Substance Use Topics   Alcohol use: Not Currently    Alcohol/week: 0.0 standard drinks of alcohol    Comment: occasion   Drug use: No    Allergies: No Known Allergies  Medications Prior to Admission   Medication Sig Dispense Refill Last Dose   benzonatate (TESSALON) 100 MG capsule Take 1 capsule (100 mg total) by mouth 3 (three) times daily as needed for cough. 30 capsule 0    cetirizine (ZYRTEC ALLERGY) 10 MG tablet Take 1 tablet (10 mg total) by mouth daily. 30 tablet 0    fluconazole (DIFLUCAN) 150 MG tablet Take 1 tablet (150 mg total) by mouth every 3 (three) days. Take first pill today.  May take second pill in 3 days if no resolution of symptoms. (Patient not taking: Reported on 04/28/2022) 2 tablet 0    ibuprofen (ADVIL) 800 MG tablet Take 1 tablet (800 mg total) by mouth 3 (three) times daily. 21 tablet 0    metroNIDAZOLE (FLAGYL) 500 MG tablet Take 500 mg by mouth 2 (two) times daily.      norethindrone (MICRONOR,CAMILA,ERRIN) 0.35 MG tablet Take 1 tablet (0.35 mg total) by mouth daily. (Patient not taking: Reported on 09/16/2020) 1 Package 11    oseltamivir (TAMIFLU) 75 MG capsule Take 1 capsule (75 mg total) by mouth 2 (  two) times daily. 10 capsule 0    promethazine-dextromethorphan (PROMETHAZINE-DM) 6.25-15 MG/5ML syrup Take 2.5 mLs by mouth 3 (three) times daily as needed for cough. 100 mL 0    pseudoephedrine (SUDAFED) 30 MG tablet Take 1 tablet (30 mg total) by mouth every 8 (eight) hours as needed for congestion. 30 tablet 0     Review of Systems  Constitutional:  Negative for chills, fatigue and fever.  Eyes:  Negative for pain and visual disturbance.  Respiratory:  Negative for apnea, shortness of breath and wheezing.   Cardiovascular:  Negative for chest pain and palpitations.  Gastrointestinal:  Negative for abdominal pain, constipation, diarrhea, nausea and vomiting.  Genitourinary:  Positive for pelvic pain. Negative for difficulty urinating, dysuria, vaginal bleeding, vaginal discharge and vaginal pain.  Musculoskeletal:  Negative for back pain.  Neurological:  Positive for headaches. Negative for seizures and weakness.  Psychiatric/Behavioral:  Negative for suicidal  ideas.    Physical Exam   Blood pressure 114/63, pulse 87, temperature 98.3 F (36.8 C), temperature source Oral, resp. rate 17, height 5\' 3"  (1.6 m), weight 72.3 kg, last menstrual period 04/20/2022, SpO2 100%.  Physical Exam Vitals and nursing note reviewed.  Constitutional:      General: She is not in acute distress.    Appearance: Normal appearance.  HENT:     Head: Normocephalic.  Pulmonary:     Effort: Pulmonary effort is normal.  Musculoskeletal:     Cervical back: Normal range of motion.  Skin:    General: Skin is warm and dry.  Neurological:     Mental Status: She is alert and oriented to person, place, and time.     GCS: GCS eye subscore is 4. GCS verbal subscore is 5. GCS motor subscore is 6.  Psychiatric:        Mood and Affect: Mood normal.    FHT obtained in triage   MAU Course  Procedures Orders Placed This Encounter  Procedures   Urinalysis, Routine w reflex microscopic -Urine, Clean Catch   Discharge patient   Discharge patient   Meds ordered this encounter  Medications   acetaminophen-caffeine (EXCEDRIN TENSION HEADACHE) 500-65 MG per tablet 2 tablet    Results for orders placed or performed during the hospital encounter of 02/03/23 (from the past 24 hour(s))  Urinalysis, Routine w reflex microscopic -Urine, Clean Catch     Status: Abnormal   Collection Time: 02/03/23  2:24 PM  Result Value Ref Range   Color, Urine YELLOW YELLOW   APPearance HAZY (A) CLEAR   Specific Gravity, Urine 1.018 1.005 - 1.030   pH 6.0 5.0 - 8.0   Glucose, UA NEGATIVE NEGATIVE mg/dL   Hgb urine dipstick SMALL (A) NEGATIVE   Bilirubin Urine NEGATIVE NEGATIVE   Ketones, ur NEGATIVE NEGATIVE mg/dL   Protein, ur NEGATIVE NEGATIVE mg/dL   Nitrite NEGATIVE NEGATIVE   Leukocytes,Ua NEGATIVE NEGATIVE   RBC / HPF 0-5 0 - 5 RBC/hpf   WBC, UA 0-5 0 - 5 WBC/hpf   Bacteria, UA RARE (A) NONE SEEN   Squamous Epithelial / HPF 6-10 0 - 5 /HPF   Mucus PRESENT      MDM - UA  hazy with a small amount of blood but otherwise normal.  - Headache relieved with PO Excedrin  - Pain description consistent with Round Ligament pain.   Assessment and Plan   1. Pregnancy headache in second trimester   2. [redacted] weeks gestation of pregnancy   3. Round ligament  pain    - Reviewed that headaches can be a normal discomfort of pregnancy.  - Also discussed round ligament pain. Reviewed stretches and comfort measures than can help.  - Reviewed worsening signs and return precautions.  - Patient discharged home in stable condition and may return to MAU as needed.   Claudette Head, MSN CNM  02/03/2023, 2:52 PM

## 2023-02-03 NOTE — MAU Note (Signed)
Kristy Davis is a 32 y.o. at [redacted]w[redacted]d, here in MAU reporting: had a HA for 3 days, comes and goes, throbs through her eyes during the night. Yesterday her vision got blurry, off and on. Has very mild HA now, has taken Tylenol- doesn't help. Since 12wks, weeks, will get a sharp pain in pelvis, esp on left, like someone is stabbing through when she stands and sometimes walks.  (Has been seen in office) Denies GI or GU problems. Onset of complaint: Wed Pain score: mild HA now Vitals:   02/03/23 1414  BP: 114/63  Pulse: 87  Resp: 17  Temp: 98.3 F (36.8 C)  SpO2: 100%     FHT:144 Lab orders placed from triage:  UA

## 2023-04-18 NOTE — L&D Delivery Note (Signed)
 Delivery Note    Patient Name: Kristy Davis DOB: 1990/08/04 MRN: [id number]  Date of admission: 07/19/2023 Delivering MD: Dale Ainsworth Date of delivery: 07/19/2023 Type of delivery: SVD  Newborn Data: Live born female  Birth Weight:   APGAR: ,   Newborn Delivery   Birth date/time: 07/19/2023 12:37:00 Delivery type: Vaginal, Spontaneous       Kristy Davis, 33 y.o., @ [redacted]w[redacted]d,  G2P1001, who was admitted for latent labor, gbs-. I was called to the room when she progressed 2+ station in the second stage of labor with cat 2 strip which resolved post contraction. DR Sallye Ober aware and in house for assists if needed.  She pushed for 15/min.  She delivered a viable infant, cephalic and restituted to the LOA position over an intact perineum.  A nuchal cord   was identified loose and reduced with body cord and thick particulate meconium noted with NICU present in the room for birth. The baby was placed on maternal abdomen while initial step of NRP were perfmored (Dry, Stimulated, and warmed). Hat placed on baby for thermoregulation. Delayed cord clamping was performed for 2 minutes.  Cord double clamped and cut.  Cord cut by God Mother. Apgar scores were 8 and 9. Prophylactic Pitocin was started in the third stage of labor for active management. The placenta delivered spontaneously, marginal cord insertion, shultz, with a 3 vessel cord and was sent to LD.  Inspection revealed none. An examination of the vaginal vault and cervix was free from lacerations. The uterus was firm, bleeding stable.   Placenta and umbilical artery blood gas were not sent.  There were no complications during the procedure.  Mom and baby skin to skin following delivery. Left in stable condition.  Maternal Info: Anesthesia: Epidural Episiotomy: no Lacerations:  no Suture Repair: no Est. Blood Loss (mL):   Newborn Info:  Baby Sex: female Circumcision: in pt desired  APGAR (1 MIN):   APGAR (5 MINS):   APGAR (10  MINS):     Mom to postpartum.  Baby to Couplet care / Skin to Skin.  Delivery Report:    Review the Delivery Report for details.   [facility] CNM, FNP-C, PMHNP-BC  [address]  [place], [place] [address]  Cell: [phone]  Office Phone: [phone] Fax: [phone] 07/19/2023  1:00 PM

## 2023-05-10 LAB — OB RESULTS CONSOLE HIV ANTIBODY (ROUTINE TESTING): HIV: NONREACTIVE

## 2023-07-19 ENCOUNTER — Inpatient Hospital Stay (HOSPITAL_COMMUNITY): Admitting: Anesthesiology

## 2023-07-19 ENCOUNTER — Encounter (HOSPITAL_COMMUNITY): Payer: Self-pay | Admitting: Obstetrics & Gynecology

## 2023-07-19 ENCOUNTER — Inpatient Hospital Stay (HOSPITAL_COMMUNITY)
Admission: AD | Admit: 2023-07-19 | Discharge: 2023-07-21 | DRG: 806 | Disposition: A | Discharge status: Home / Self Care | Attending: Obstetrics & Gynecology | Admitting: Obstetrics & Gynecology

## 2023-07-19 DIAGNOSIS — O9832 Other infections with a predominantly sexual mode of transmission complicating childbirth: Secondary | ICD-10-CM | POA: Diagnosis present

## 2023-07-19 DIAGNOSIS — A6000 Herpesviral infection of urogenital system, unspecified: Secondary | ICD-10-CM | POA: Diagnosis present

## 2023-07-19 DIAGNOSIS — O99824 Streptococcus B carrier state complicating childbirth: Secondary | ICD-10-CM | POA: Diagnosis present

## 2023-07-19 DIAGNOSIS — O43193 Other malformation of placenta, third trimester: Secondary | ICD-10-CM | POA: Diagnosis present

## 2023-07-19 DIAGNOSIS — Z3A39 39 weeks gestation of pregnancy: Secondary | ICD-10-CM

## 2023-07-19 DIAGNOSIS — O26893 Other specified pregnancy related conditions, third trimester: Secondary | ICD-10-CM | POA: Diagnosis present

## 2023-07-19 LAB — CBC
HCT: 38.7 % (ref 36.0–46.0)
HCT: 36.7 % (ref 36.0–46.0)
Hemoglobin: 12.7 g/dL (ref 12.0–15.0)
Hemoglobin: 12 g/dL (ref 12.0–15.0)
MCH: 31 pg (ref 26.0–34.0)
MCH: 31 pg (ref 26.0–34.0)
MCHC: 32.8 g/dL (ref 30.0–36.0)
MCHC: 32.7 g/dL (ref 30.0–36.0)
MCV: 94.4 fL (ref 80.0–100.0)
MCV: 94.8 fL (ref 80.0–100.0)
Platelets: 152 10*3/uL (ref 150–400)
Platelets: 150 10*3/uL (ref 150–400)
RBC: 4.1 MIL/uL (ref 3.87–5.11)
RBC: 3.87 MIL/uL (ref 3.87–5.11)
RDW: 13.8 % (ref 11.5–15.5)
RDW: 13.6 % (ref 11.5–15.5)
WBC: 12.8 10*3/uL — ABNORMAL HIGH (ref 4.0–10.5)
WBC: 15.6 10*3/uL — ABNORMAL HIGH (ref 4.0–10.5)
nRBC: 0 % (ref 0.0–0.2)
nRBC: 0 % (ref 0.0–0.2)

## 2023-07-19 LAB — TYPE AND SCREEN
ABO/RH(D): A POS
Antibody Screen: NEGATIVE

## 2023-07-19 MED ORDER — DIPHENHYDRAMINE HCL 50 MG/ML IJ SOLN: 12.5000 mg | INTRAMUSCULAR | Status: DC | PRN

## 2023-07-19 MED ORDER — OXYCODONE-ACETAMINOPHEN 5-325 MG PO TABS: 1.0000 | ORAL_TABLET | ORAL | Status: DC | PRN

## 2023-07-19 MED ORDER — SOD CITRATE-CITRIC ACID 500-334 MG/5ML PO SOLN: 30.0000 mL | ORAL | Status: DC | PRN

## 2023-07-19 MED ORDER — DIPHENHYDRAMINE HCL 25 MG PO CAPS: 25.0000 mg | ORAL_CAPSULE | Freq: Four times a day (QID) | ORAL | Status: DC | PRN

## 2023-07-19 MED ORDER — ONDANSETRON HCL 4 MG PO TABS: 4.0000 mg | ORAL_TABLET | ORAL | Status: DC | PRN

## 2023-07-19 MED ORDER — WITCH HAZEL-GLYCERIN EX PADS: 1.0000 | MEDICATED_PAD | CUTANEOUS | Status: DC | PRN

## 2023-07-19 MED ORDER — BENZOCAINE-MENTHOL 20-0.5 % EX AERO: 1.0000 | INHALATION_SPRAY | CUTANEOUS | Status: DC | PRN

## 2023-07-19 MED ORDER — OXYTOCIN-SODIUM CHLORIDE 30-0.9 UT/500ML-% IV SOLN: 2.5000 [IU]/h | INTRAVENOUS | Status: DC

## 2023-07-19 MED ORDER — FENTANYL CITRATE (PF) 100 MCG/2ML IJ SOLN: 50.0000 ug | INTRAMUSCULAR | Status: DC | PRN

## 2023-07-19 MED ORDER — PRENATAL MULTIVITAMIN CH
1.0000 | ORAL_TABLET | Freq: Every day | ORAL | Status: DC
Start: 2023-07-20 — End: 2023-07-21
  Administered 2023-07-20 – 2023-07-21 (×2): 1 via ORAL
  Filled 2023-07-19 (×2): qty 1

## 2023-07-19 MED ORDER — FENTANYL CITRATE (PF) 100 MCG/2ML IJ SOLN: 100.0000 ug | INTRAMUSCULAR | Status: DC | PRN

## 2023-07-19 MED ORDER — OXYTOCIN-SODIUM CHLORIDE 30-0.9 UT/500ML-% IV SOLN
2.5000 [IU]/h | INTRAVENOUS | Status: DC
  Filled 2023-07-19: qty 500

## 2023-07-19 MED ORDER — TETANUS-DIPHTH-ACELL PERTUSSIS 5-2.5-18.5 LF-MCG/0.5 IM SUSY: 0.5000 mL | PREFILLED_SYRINGE | Freq: Once | INTRAMUSCULAR | Status: DC

## 2023-07-19 MED ORDER — PHENYLEPHRINE 80 MCG/ML (10ML) SYRINGE FOR IV PUSH (FOR BLOOD PRESSURE SUPPORT): 80.0000 ug | PREFILLED_SYRINGE | INTRAVENOUS | Status: DC | PRN

## 2023-07-19 MED ORDER — OXYTOCIN BOLUS FROM INFUSION: 333.0000 mL | Freq: Once | INTRAVENOUS | Status: DC

## 2023-07-19 MED ORDER — LACTATED RINGERS IV SOLN: 500.0000 mL | Freq: Once | INTRAVENOUS | Status: DC

## 2023-07-19 MED ORDER — ONDANSETRON HCL 4 MG/2ML IJ SOLN: 4.0000 mg | Freq: Four times a day (QID) | INTRAMUSCULAR | Status: DC | PRN

## 2023-07-19 MED ORDER — LIDOCAINE HCL (PF) 1 % IJ SOLN: 30.0000 mL | INTRAMUSCULAR | Status: DC | PRN

## 2023-07-19 MED ORDER — COCONUT OIL OIL: 1.0000 | TOPICAL_OIL | Status: DC | PRN

## 2023-07-19 MED ORDER — FENTANYL-BUPIVACAINE-NACL 0.5-0.125-0.9 MG/250ML-% EP SOLN
12.0000 mL/h | EPIDURAL | Status: DC | PRN
  Administered 2023-07-19: 12 mL/h via EPIDURAL
  Filled 2023-07-19: qty 250

## 2023-07-19 MED ORDER — IBUPROFEN 600 MG PO TABS
600.0000 mg | ORAL_TABLET | Freq: Four times a day (QID) | ORAL | Status: DC
  Administered 2023-07-19 – 2023-07-21 (×9): 600 mg via ORAL
  Filled 2023-07-19 (×9): qty 1

## 2023-07-19 MED ORDER — LACTATED RINGERS IV SOLN: 500.0000 mL | INTRAVENOUS | Status: DC | PRN

## 2023-07-19 MED ORDER — EPHEDRINE 5 MG/ML INJ: 10.0000 mg | INTRAVENOUS | Status: DC | PRN

## 2023-07-19 MED ORDER — ONDANSETRON HCL 4 MG/2ML IJ SOLN: 4.0000 mg | INTRAMUSCULAR | Status: DC | PRN

## 2023-07-19 MED ORDER — ACETAMINOPHEN 325 MG PO TABS: 650.0000 mg | ORAL_TABLET | ORAL | Status: DC | PRN

## 2023-07-19 MED ORDER — SIMETHICONE 80 MG PO CHEW: 80.0000 mg | CHEWABLE_TABLET | ORAL | Status: DC | PRN

## 2023-07-19 MED ORDER — LIDOCAINE-EPINEPHRINE (PF) 1.5 %-1:200000 IJ SOLN
INTRAMUSCULAR | Status: DC | PRN
  Administered 2023-07-19: 5 mL via EPIDURAL

## 2023-07-19 MED ORDER — ZOLPIDEM TARTRATE 5 MG PO TABS: 5.0000 mg | ORAL_TABLET | Freq: Every evening | ORAL | Status: DC | PRN

## 2023-07-19 MED ORDER — OXYCODONE-ACETAMINOPHEN 5-325 MG PO TABS: 2.0000 | ORAL_TABLET | ORAL | Status: DC | PRN

## 2023-07-19 MED ORDER — LACTATED RINGERS IV SOLN: INTRAVENOUS | Status: DC

## 2023-07-19 MED ORDER — SENNOSIDES-DOCUSATE SODIUM 8.6-50 MG PO TABS
2.0000 | ORAL_TABLET | Freq: Every day | ORAL | Status: DC
  Administered 2023-07-20 – 2023-07-21 (×2): 2 via ORAL
  Filled 2023-07-19 (×2): qty 2

## 2023-07-19 MED ORDER — DIBUCAINE (PERIANAL) 1 % EX OINT: 1.0000 | TOPICAL_OINTMENT | CUTANEOUS | Status: DC | PRN

## 2023-07-19 MED ORDER — OXYTOCIN 10 UNIT/ML IJ SOLN: 10.0000 [IU] | Freq: Once | INTRAMUSCULAR | Status: DC | PRN

## 2023-07-19 MED ORDER — ACETAMINOPHEN 325 MG PO TABS
650.0000 mg | ORAL_TABLET | ORAL | Status: DC | PRN
Start: 2023-07-19 — End: 2023-07-19

## 2023-07-19 NOTE — H&P (Signed)
 Kristy Davis is a 33 y.o. female, G2P1001, IUP at 39 weeks, presenting for Latent labor at 5 cm. GBS-. 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   Pt endorse + Fm. Denies vaginal leakage. Denies vaginal bleeding.  Patient Active Problem List   Diagnosis Date Noted   Indication for care in labor or delivery 07/19/2023   Round ligament pain 02/03/2023   Postpartum care following vaginal delivery 03/07/2018   Contraception management 03/07/2018   Migraine      Active Ambulatory Problems    Diagnosis Date Noted   Migraine    Postpartum care following vaginal delivery 03/07/2018   Contraception management 03/07/2018   Round ligament pain 02/03/2023   Resolved Ambulatory Problems    Diagnosis Date Noted   Hydrosalpinx 11/23/2010   Supervision of normal pregnancy, antepartum 07/16/2017   Constipation during pregnancy in second trimester 08/26/2017   Gas pain 08/26/2017   [redacted] weeks gestation of pregnancy    Encounter for fetal anatomic survey    Placenta previa antepartum in second trimester    Genital herpes affecting pregnancy 12/26/2017   Labor and delivery, indication for care 02/06/2018   Past Medical History:  Diagnosis Date   Allergy    Ovarian cyst       Medications Prior to Admission  Medication Sig Dispense Refill Last Dose/Taking   benzonatate (TESSALON) 100 MG capsule Take 1 capsule (100 mg total) by mouth 3 (three) times daily as needed for cough. 30 capsule 0    cetirizine (ZYRTEC ALLERGY) 10 MG tablet Take 1 tablet (10 mg total) by mouth daily. 30 tablet 0    fluconazole (DIFLUCAN) 150 MG tablet Take 1 tablet (150 mg total) by mouth every 3 (three) days. Take first pill today.  May take second pill in 3 days if no resolution of symptoms. (Patient not taking: Reported on 04/28/2022) 2 tablet 0    ibuprofen (ADVIL) 800 MG tablet Take 1 tablet (800 mg total) by mouth 3 (three) times daily. 21 tablet 0    metroNIDAZOLE (FLAGYL) 500 MG tablet Take 500 mg by mouth 2  (two) times daily.      norethindrone (MICRONOR,CAMILA,ERRIN) 0.35 MG tablet Take 1 tablet (0.35 mg total) by mouth daily. (Patient not taking: Reported on 09/16/2020) 1 Package 11    oseltamivir (TAMIFLU) 75 MG capsule Take 1 capsule (75 mg total) by mouth 2 (two) times daily. 10 capsule 0    promethazine-dextromethorphan (PROMETHAZINE-DM) 6.25-15 MG/5ML syrup Take 2.5 mLs by mouth 3 (three) times daily as needed for cough. 100 mL 0    pseudoephedrine (SUDAFED) 30 MG tablet Take 1 tablet (30 mg total) by mouth every 8 (eight) hours as needed for congestion. 30 tablet 0     Past Medical History:  Diagnosis Date   Allergy    Migraine    Ovarian cyst      No current facility-administered medications on file prior to encounter.   Current Outpatient Medications on File Prior to Encounter  Medication Sig Dispense Refill   benzonatate (TESSALON) 100 MG capsule Take 1 capsule (100 mg total) by mouth 3 (three) times daily as needed for cough. 30 capsule 0   cetirizine (ZYRTEC ALLERGY) 10 MG tablet Take 1 tablet (10 mg total) by mouth daily. 30 tablet 0   fluconazole (DIFLUCAN) 150 MG tablet Take 1 tablet (150 mg total) by mouth every 3 (three) days. Take first pill today.  May take second pill in 3 days if no resolution  of symptoms. (Patient not taking: Reported on 04/28/2022) 2 tablet 0   ibuprofen (ADVIL) 800 MG tablet Take 1 tablet (800 mg total) by mouth 3 (three) times daily. 21 tablet 0   metroNIDAZOLE (FLAGYL) 500 MG tablet Take 500 mg by mouth 2 (two) times daily.     norethindrone (MICRONOR,CAMILA,ERRIN) 0.35 MG tablet Take 1 tablet (0.35 mg total) by mouth daily. (Patient not taking: Reported on 09/16/2020) 1 Package 11   oseltamivir (TAMIFLU) 75 MG capsule Take 1 capsule (75 mg total) by mouth 2 (two) times daily. 10 capsule 0   promethazine-dextromethorphan (PROMETHAZINE-DM) 6.25-15 MG/5ML syrup Take 2.5 mLs by mouth 3 (three) times daily as needed for cough. 100 mL 0   pseudoephedrine  (SUDAFED) 30 MG tablet Take 1 tablet (30 mg total) by mouth every 8 (eight) hours as needed for congestion. 30 tablet 0     No Known Allergies  History of present pregnancy: Pt Info/Preference:  Screening/Consents:  Labs:   EDD: Estimated Date of Delivery: 07/26/23  Establised: Patient's last menstrual period was 04/20/2022.  Anatomy Scan: Date: 03/12/2024 Placenta Location: posterior Genetic Screen: Panoroma:LR AFP:  First Tri: Quad: Horizon:  Office: [facility]            First PNV: 8.6 weeks Blood Type  A+  Language: english Last PNV: 38.6 weeks Rhogam  No  Flu Vaccine:  UTD   Antibody  Neg  TDaP vaccine UTD   GTT: Early: 5.0 Third Trimester: 84  Feeding Plan: BR BTL: no Rubella:  Immune  Contraception: ??? VBAC: no RPR:   NR  Circumcision: In pt desired   HBsAg:  Neg  Pediatrician:  ???   HIV:   Neg  Prenatal Classes: no Additional [place]: 34.2 , breech, BPP 8/8, AFI 14.5 GBS:   Negative (For PCN allergy, check sensitivities)       Chlamydia: Neg    MFM Referral/Consult:  GC: Neg  Support Person: God mother   PAP: ???  Pain Management: epidural Neonatologist Referral:  Hgb Electrophoresis:  AA  Birth Plan: DCC   Hgb NOB: 11.3    28W: 11.2   OB History     Gravida  2   Para  1   Term  1   Preterm  0   AB  0   Living  1      SAB  0   IAB  0   Ectopic  0   Multiple  0   Live Births  1          Past Medical History:  Diagnosis Date   Allergy    Migraine    Ovarian cyst    Past Surgical History:  Procedure Laterality Date   WISDOM TOOTH EXTRACTION     Family History: family history is not on file. Social History:  reports that she has never smoked. She has never used smokeless tobacco. She reports that she does not currently use alcohol. She reports that she does not use drugs.   Prenatal Transfer Tool  Maternal Diabetes: No Genetic Screening: Normal Maternal Ultrasounds/Referrals: Normal Fetal Ultrasounds or other Referrals:  None Maternal Substance  Abuse:  No Significant Maternal Medications:  None Significant Maternal Lab Results: Group B Strep negative  ROS:  Review of Systems  Constitutional: Negative.   HENT: Negative.    Eyes: Negative.   Respiratory: Negative.    Cardiovascular: Negative.   Gastrointestinal:  Positive for abdominal pain.  Genitourinary: Negative.   Musculoskeletal: Negative.   Skin:  Negative.   Neurological: Negative.   Endo/Heme/Allergies: Negative.   Psychiatric/Behavioral: Negative.       Physical Exam: LMP 04/20/2022   Physical Exam Vitals and nursing note reviewed.  Constitutional:      Appearance: Normal appearance.  HENT:     Head: Normocephalic and atraumatic.     Nose: Nose normal.     Mouth/Throat:     Mouth: Mucous membranes are moist.  Eyes:     Conjunctiva/sclera: Conjunctivae normal.  Cardiovascular:     Rate and Rhythm: Normal rate and regular rhythm.     Pulses: Normal pulses.     Heart sounds: Normal heart sounds.  Pulmonary:     Effort: Pulmonary effort is normal.     Breath sounds: Normal breath sounds.  Abdominal:     General: Bowel sounds are normal.  Genitourinary:    Comments: Pelvis adequate , uterus gravida equal to dates.  Musculoskeletal:        General: Normal range of motion.     Cervical back: Normal range of motion and neck supple.  Skin:    General: Skin is warm.     Capillary Refill: Capillary refill takes less than 2 seconds.  Neurological:     General: No focal deficit present.     Mental Status: She is alert.  Psychiatric:        Mood and Affect: Mood normal.     NST: FHR baseline 130 bpm, Variability: moderate, Accelerations:present, Deceleration present lates noted= Cat 2/Reactive UC:   regular, every 3 minutes SVE:   Dilation: 5 Effacement (%): 80 Station: -2 Exam by:: [name], CNM, vertex verified by fetal sutures.  Leopold's: Position vertex, EFW 6lbs via leopold's.   Labs: No results found for this or any previous visit  (from the past 24 hours).  Imaging:  No results found.  MAU Course: Orders Placed This Encounter  Procedures   CBC   RPR   Diet clear liquid Room service appropriate? Yes; Fluid consistency: Thin   Contraction - monitoring   External fetal heart monitoring   Vaginal exam   Cervical Exam   Insert urethral catheter X 1 PRN If Coude Catheter is chosen, qualified resources by campus can be found in the clinical skills nursing procedure for Coude Catheter 1. If straight catheterized > 2 times or patient unable to void post epidural plac...   Type and screen [facility]   Insert and maintain IV Line   Admit to Inpatient (patient's expected length of stay will be greater than 2 midnights or inpatient only procedure)   Meds ordered this encounter  Medications   lactated ringers infusion   lactated ringers infusion 500-1,000 mL   fentaNYL (SUBLIMAZE) injection 100 mcg    Assessment/Plan: Kristy Davis is a 33 y.o. female, G2P1001, IUP at 39 weeks, presenting for Latent labor at 5 cm. GBS-. 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   Pt endorse + Fm. Denies vaginal leakage. Denies vaginal bleeding.   FWB: Cat 1 Fetal Tracing.   Plan: Admit to Birthing Suite per consult with Dr Sallye Ober Routine [facility] orders Pain med/epidural prn Cat 2 strip, position change, plan to AROM, IV bolus. Anticipate labor progression   [facility] CNM, FNP-C, PMHNP-BC  [address]  [place], [place] [address]  Cell: [phone]  Office Phone: [phone] Fax: [phone] 07/19/2023  11:10 AM

## 2023-07-19 NOTE — MAU Note (Addendum)
...  Kristy Davis is a 33 y.o. at [redacted]w[redacted]d here in MAU reporting: CTX's since 0300 this morning that became stronger around 0700. Denies VB or LOF. +FM. Desires epidural. Reports her cervix was closed yesterday in office.  -Patient reports GBS-. Unable to verify within prenatal records. Arita Miss, CNM at bedside. Patient reporting severe pressure. SVE performed. Patient 580-2.  Onset of complaint: 0300 Pain score: 10/10 lower abdomen  FHT: 115 initial external Lab orders placed from triage: MAU Labor Eval

## 2023-07-19 NOTE — Anesthesia Procedure Notes (Signed)
 Epidural Patient location during procedure: OB Start time: 07/19/2023 11:55 AM End time: 07/19/2023 12:01 PM  Staffing Anesthesiologist: Atilano Median, DO Performed: anesthesiologist   Preanesthetic Checklist Completed: patient identified, IV checked, site marked, risks and benefits discussed, surgical consent, monitors and equipment checked, pre-op evaluation and timeout performed  Epidural Patient position: sitting Prep: ChloraPrep Patient monitoring: heart rate, continuous pulse ox and blood pressure Approach: midline Location: L4-L5 Injection technique: LOR saline  Needle:  Needle type: Tuohy  Needle gauge: 17 G Needle length: 9 cm Needle insertion depth: 6 cm Catheter type: closed end flexible Catheter size: 20 Guage Catheter at skin depth: 11 cm Test dose: negative and 1.5% lidocaine with Epi 1:200 K  Assessment Events: blood not aspirated, no cerebrospinal fluid, injection not painful, no injection resistance and no paresthesia  Additional Notes Patient identified. Risks/Benefits/Options discussed with patient including but not limited to bleeding, infection, nerve damage, paralysis, failed block, incomplete pain control, headache, blood pressure changes, nausea, vomiting, reactions to medications, itching and postpartum back pain. Confirmed with bedside nurse the patient's most recent platelet count. Confirmed with patient that they are not currently taking any anticoagulation, have any bleeding history or any family history of bleeding disorders. Patient expressed understanding and wished to proceed. All questions were answered. Sterile technique was used throughout the entire procedure. Please see nursing notes for vital signs. Test dose was given through epidural catheter and negative prior to continuing to dose epidural or start infusion. Warning signs of high block given to the patient including shortness of breath, tingling/numbness in hands, complete motor block,  or any concerning symptoms with instructions to call for help. Patient was given instructions on fall risk and not to get out of bed. All questions and concerns addressed with instructions to call with any issues or inadequate analgesia.    Reason for block:procedure for pain

## 2023-07-19 NOTE — MAU Provider Note (Addendum)
 [name] is a G2P1001 at [redacted]w[redacted]d seen in MAU for labor. She reports contractions since 0300, but she "thought they were BH ctx's." She states the contractions became stronger and closer together at around 0600. She endorses good (+) FM today. She denies SROM. She was seen at her OB office yesterday and she was closed. She receives [facility] with [facility].   SVE by CNM Dilation: 5 Effacement (%): 80 Station: -2 Exam by:: [name], CNM   NST - FHR: 110 bpm / moderate variability / accels present / decels absent / TOCO: regular every 2-3 mins    *TC with Dr. Sallye Ober @ 1103 - notified of patient's complaints, assessments, lab & NST results, tx plan admit to L&D with orders for epidural - agrees with plan; Dr. Sallye Ober will notify [facility], CNM who is on-call with her.  Plan: 1. Indication for care in labor or delivery (Primary)  2. [redacted] weeks gestation of pregnancy - Admit to L&D  - Routine admission orders  - Patient may have epidural - [facility] provider to assume care and document H&P upon admission  [name], CNM  07/19/2023 11:06 AM

## 2023-07-19 NOTE — Anesthesia Preprocedure Evaluation (Signed)
 Anesthesia Evaluation  Patient identified by MRN, date of birth, ID band Patient awake    Reviewed: Allergy & Precautions, NPO status , Patient's Chart, lab work & pertinent test results  Airway Mallampati: II  TM Distance: >3 FB Neck ROM: Full    Dental no notable dental hx.    Pulmonary neg pulmonary ROS   Pulmonary exam normal        Cardiovascular negative cardio ROS  Rhythm:Regular Rate:Normal     Neuro/Psych  Headaches  negative psych ROS   GI/Hepatic negative GI ROS, Neg liver ROS,,,  Endo/Other  negative endocrine ROS    Renal/GU negative Renal ROS  negative genitourinary   Musculoskeletal negative musculoskeletal ROS (+)    Abdominal Normal abdominal exam  (+)   Peds  Hematology Lab Results      Component                Value               Date                      WBC                      12.8 (H)            07/19/2023                HGB                      12.7                07/19/2023                HCT                      38.7                07/19/2023                MCV                      94.4                07/19/2023                PLT                      152                 07/19/2023              Anesthesia Other Findings   Reproductive/Obstetrics (+) Pregnancy                             Anesthesia Physical Anesthesia Plan  ASA: 2  Anesthesia Plan: Epidural   Post-op Pain Management:    Induction: Intravenous  PONV Risk Score and Plan: 2 and Treatment may vary due to age or medical condition  Airway Management Planned: Natural Airway  Additional Equipment: None  Intra-op Plan:   Post-operative Plan:   Informed Consent: I have reviewed the patients History and Physical, chart, labs and discussed the procedure including the risks, benefits and alternatives for the proposed anesthesia with the patient or authorized representative who has indicated  his/her understanding and acceptance.     Dental advisory  given  Plan Discussed with:   Anesthesia Plan Comments:        Anesthesia Quick Evaluation

## 2023-07-20 LAB — RPR
RPR Ser Ql: NONREACTIVE
RPR Ser Ql: NONREACTIVE

## 2023-07-20 LAB — CBC
HCT: 32.2 % — ABNORMAL LOW (ref 36.0–46.0)
Hemoglobin: 10.5 g/dL — ABNORMAL LOW (ref 12.0–15.0)
MCH: 31.2 pg (ref 26.0–34.0)
MCHC: 32.6 g/dL (ref 30.0–36.0)
MCV: 95.5 fL (ref 80.0–100.0)
Platelets: 138 10*3/uL — ABNORMAL LOW (ref 150–400)
RBC: 3.37 MIL/uL — ABNORMAL LOW (ref 3.87–5.11)
RDW: 13.8 % (ref 11.5–15.5)
WBC: 15.6 10*3/uL — ABNORMAL HIGH (ref 4.0–10.5)
nRBC: 0 % (ref 0.0–0.2)

## 2023-07-20 NOTE — Lactation Note (Signed)
 This note was copied from a baby's chart. Lactation Consultation Note  Patient Name: Kristy Davis [id number] Date: 07/20/2023 Age:35 hours Reason for consult: Initial assessment;1st time breastfeeding;Term;Infant < 6lbs.  MOB attempted latch infant on her right breast using the cradle hold, infant licked and tasted but did not latch at the breast. Afterwards MOB hand expressed 11 mls of EBM that was spoon fed to infant. MOB is following the LPTI/LBW feeding guidelines and knows to supplement infant at each feeding on day one with (10-12 mls) of EBM/ 22 kcal formula. LC set MOB up with DEBP, MOB will continue to pump every 3 hours for 15 minutes on initial setting. LC discussed the importance of maternal rest, meals and hydration. MOB made aware of O/P services, breastfeeding support groups, community resources, and our phone # for post-discharge questions.   MOB knows that her EBM is safe for 4 hours whereas formula once open must be used within 1 hour.   Current feeding plan: 1- MOB will continue to follow LPTI/LBW infant feeding guideliness, breastfeeding infant every 3 hours and limiting total feedings breast and bottle feeding to 30 minutes or less. 2- MOB knows to call for further latch assistance if needed. 3- MOB will continue to pump every 3 hours for 15 minutes on initial setting and give infant any EBM first and then 22 kcal formula 4- MOB knows due infant being less than 5 lbs 8 ounces to supplement infant after each feeding. Day 1 to offer (10-12 mls) of EBM/  22 kcal Formula per feeding.  Maternal Data Has patient been taught Hand Expression?: Yes Does the patient have breastfeeding experience prior to this delivery?: Yes How long did the patient breastfeed?: Per MOB, she breastfeed her 35 year old for 2 years/  Feeding Mother's Current Feeding Choice: Breast Milk and Formula Nipple Type: Nfant Standard Flow (white)  LATCH Score Latch: Too sleepy or reluctant, no latch  achieved, no sucking elicited.  Audible Swallowing: None  Type of Nipple: Everted at rest and after stimulation  Comfort (Breast/Nipple): Soft / non-tender  Hold (Positioning): Assistance needed to correctly position infant at breast and maintain latch.  LATCH Score: 5   Lactation Tools Discussed/Used Tools: Flanges;Pump Flange Size: 21 Breast pump type: Double-Electric Breast Pump Pump Education: Setup, frequency, and cleaning;Milk Storage Reason for Pumping: Infant is LBW infant currently not latching well at the breast. Pumping frequency: MOB will continue to use DEBP every 3 hours for 15 minutes on inital setting.  Interventions Interventions: Breast feeding basics reviewed;Assisted with latch;Skin to skin;Hand express;Breast compression;Adjust position;Support pillows;Position options;Expressed milk;DEBP;Education;Guidelines for Milk Supply and Pumping Schedule Handout;LC Services brochure;LPT handout/interventions;CDC milk storage guidelines;CDC Guidelines for Breast Pump Cleaning  Discharge Pump: Hands Free;DEBP  Consult Status Consult Status: Follow-up Date: 07/21/23 Follow-up type: In-patient    Frederico Hamman 07/20/2023, 12:07 AM

## 2023-07-20 NOTE — Anesthesia Postprocedure Evaluation (Signed)
 Anesthesia Post Note  Patient: Kristy Davis  Procedure(s) Performed: AN AD HOC LABOR EPIDURAL     Anesthesia Type: Epidural Level of consciousness: awake and alert Pain management: pain level controlled Vital Signs Assessment: post-procedure vital signs reviewed and stable Respiratory status: spontaneous breathing Cardiovascular status: stable Postop Assessment: no headache Anesthetic complications: no   No notable events documented.  Last Vitals:  Vitals:   07/19/23 2302 07/20/23 0539  BP: 95/62 107/63  Pulse: 77 81  Resp: 18 17  Temp: 36.7 C 36.6 C  SpO2: 100% 100%    Last Pain:  Vitals:   07/20/23 0539  TempSrc: Oral  PainSc: 0-No pain   Pain Goal:                   Salome Arnt

## 2023-07-20 NOTE — Progress Notes (Signed)
 PPD# 1 SVD w/ intact perineum Information for the patient's newborn:  Zoei, Amison [[id number]]  female  Circumcision prior to discharge   S:   Reports feeling good Tolerating PO fluid and solids No nausea or vomiting Bleeding is light, moderate, no clots Pain controlled with acetaminophen and ibuprofen (OTC) Up ad lib / ambulatory / voiding w/o difficulty Feeding: Breast and and formula     O:   VS: BP 107/63 (BP Location: Left Arm)   Pulse 81   Temp 97.9 F (36.6 C) (Oral)   Resp 17   LMP 04/20/2022   SpO2 100%   Breastfeeding Unknown   LABS:  Recent Labs    07/19/23 1525 07/20/23 0532  WBC 15.6* 15.6*  HGB 12.0 10.5*  PLT 150 138*   Blood type: --/--/A POS (04/03 1132) Rubella: Immune (09/03 0000)                      I&O: Intake/Output      04/03 0701 04/04 0700 04/04 0701 04/05 0700   Blood 211    Total Output 211    Net -211           Physical Exam: Alert and oriented X3 Lungs: Clear and unlabored Heart: regular rate and rhythm / no mumurs Abdomen: soft, non-tender, non-distended  Fundus: firm, non-tender, U-1 Perineum: intact Lochia: appropriate Extremities: no edema, negative for calf pain, tenderness, or cords    A:  PPD # 1  Normal exam  P:  Routine postpartum orders Lactation support PRN Method of contraception - undecided, options reviewed Anticipate D/C on PP day 2 Plan reviewed w/ Dr. Evorn Gong, DNP, CNM 07/20/2023, 3:46 PM

## 2023-07-21 MED ORDER — ACETAMINOPHEN 325 MG PO TABS: 650.0000 mg | ORAL_TABLET | ORAL | Status: AC | PRN

## 2023-07-21 MED ORDER — IBUPROFEN 600 MG PO TABS: 600.0000 mg | ORAL_TABLET | Freq: Four times a day (QID) | ORAL | 0 refills | Status: AC

## 2023-07-21 MED ORDER — DOCUSATE SODIUM 100 MG PO CAPS: 100.0000 mg | ORAL_CAPSULE | Freq: Two times a day (BID) | ORAL | 0 refills | Status: AC

## 2023-07-21 NOTE — Progress Notes (Signed)
Circumcision Consent:  Routine circumcisions performed on newborns have been identified as voluntary, elective procedures by [employer] such as the [employer].  It is considered an elective procedure with no definitive medical indication and carries risks. Risks include but are not limited to bleeding, infection, damage to penis with possible need for further surgery, poor cosmesis, and local anesthetic risks. Circumcision will only be performed if patient is deemed to have normal anatomy by his Pediatrician, meets adequate criteria for a newborn of similar gestational age after birth and is without infection or other medical issue contraindicating an elective procedure. Patient understands and agrees. Patient discussed with mother of infant.  Steva Ready, DO

## 2023-07-21 NOTE — Discharge Summary (Signed)
 Postpartum Discharge Summary  Date of Service updated 07/21/23    Patient Name: Kristy Davis DOB: Jun 25, 1990 MRN: [id number]  Date of admission: 07/19/2023 Delivery date:07/19/2023 Delivering provider: Dale Demarest Date of discharge: 07/21/2023  Admitting diagnosis: Indication for care in labor or delivery [O75.9] Normal labor [O80, Z37.9] Intrauterine pregnancy: [redacted]w[redacted]d     Secondary diagnosis:  Principal Problem:   Indication for care in labor or delivery Active Problems:   Normal labor   SVD (spontaneous vaginal delivery)   Normal postpartum course  Additional problems: none    Discharge diagnosis:  Late preterm pregnancy delivered                                               Post partum procedures: none Augmentation: N/A Complications: None  Hospital course: Onset of Labor With Vaginal Delivery      33 y.o. yo G2P2002 at [redacted]w[redacted]d was admitted in Active Labor on 07/19/2023. Labor course was uncomplicated  Membrane Rupture Time/Date: 12:15 PM,07/19/2023  Delivery Method:Vaginal, Spontaneous Operative Delivery:N/A Episiotomy: None Lacerations:  None Patient had an uncomplicated postpartum course.  She is ambulating, tolerating a regular diet, passing flatus, and urinating well. Patient is discharged home in stable condition on 07/21/23.  Newborn Data: Birth date:07/19/2023 Birth time:12:37 PM Gender:Female Living status:Living Apgars:8 ,9  Weight:2280 g  Magnesium Sulfate received: No BMZ received: No Rhophylac:N/A MMR:N/A Transfusion:No Immunizations administered: Immunization History  Administered Date(s) Administered   MMR 02/07/2018   PPD Test 07/12/2013, 07/17/2014   Tdap 02/08/2018    Physical exam  Vitals:   07/20/23 0539 07/20/23 1517 07/20/23 2127 07/21/23 0624  BP: 107/63 111/78 115/66 121/66  Pulse: 81 80 78 80  Resp: 17  17 16   Temp: 97.9 F (36.6 C)  97.8 F (36.6 C) 97.9 F (36.6 C)  TempSrc: Oral  Oral Oral  SpO2: 100%  100% 100%    General: alert, cooperative, and no distress Lochia: appropriate Uterine Fundus: firm Incision: N/A DVT Evaluation: No evidence of DVT seen on physical exam. No cords or calf tenderness. No significant calf/ankle edema. Labs: Lab Results  Component Value Date   WBC 15.6 (H) 07/20/2023   HGB 10.5 (L) 07/20/2023   HCT 32.2 (L) 07/20/2023   MCV 95.5 07/20/2023   PLT 138 (L) 07/20/2023      Latest Ref Rng & Units 08/11/2022    8:21 PM  CMP  Glucose 70 - 99 mg/dL 83   BUN 6 - 20 mg/dL 12   Creatinine 6.29 - 1.00 mg/dL 5.28   Sodium 413 - 244 mmol/L 133   Potassium 3.5 - 5.1 mmol/L 3.4   Chloride 98 - 111 mmol/L 102   CO2 22 - 32 mmol/L 25   Calcium 8.9 - 10.3 mg/dL 8.7   Total Protein 6.5 - 8.1 g/dL 7.4   Total Bilirubin 0.3 - 1.2 mg/dL 0.5   Alkaline Phos 38 - 126 U/L 51   AST 15 - 41 U/L 20   ALT 0 - 44 U/L 15    Edinburgh Score:    07/20/2023   12:02 PM  Edinburgh Postnatal Depression Scale Screening Tool  I have been able to laugh and see the funny side of things. 0  I have looked forward with enjoyment to things. 0  I have blamed myself unnecessarily when things went wrong. 0  I  have been anxious or worried for no good reason. 0  I have felt scared or panicky for no good reason. 0  Things have been getting on top of me. 0  I have been so unhappy that I have had difficulty sleeping. 0  I have felt sad or miserable. 0  I have been so unhappy that I have been crying. 0  The thought of harming myself has occurred to me. 0  Edinburgh Postnatal Depression Scale Total 0      After visit meds:  Allergies as of 07/21/2023   No Known Allergies      Medication List     STOP taking these medications    benzonatate 100 MG capsule Commonly known as: TESSALON   fluconazole 150 MG tablet Commonly known as: Diflucan   metroNIDAZOLE 500 MG tablet Commonly known as: FLAGYL   norethindrone 0.35 MG tablet Commonly known as: MICRONOR   oseltamivir 75 MG  capsule Commonly known as: TAMIFLU   promethazine-dextromethorphan 6.25-15 MG/5ML syrup Commonly known as: PROMETHAZINE-DM   pseudoephedrine 30 MG tablet Commonly known as: SUDAFED       TAKE these medications    acetaminophen 325 MG tablet Commonly known as: Tylenol Take 2 tablets (650 mg total) by mouth every 4 (four) hours as needed (for pain scale < 4).   cetirizine 10 MG tablet Commonly known as: ZyrTEC Allergy Take 1 tablet (10 mg total) by mouth daily.   docusate sodium 100 MG capsule Commonly known as: Colace Take 1 capsule (100 mg total) by mouth 2 (two) times daily.   ibuprofen 600 MG tablet Commonly known as: ADVIL Take 1 tablet (600 mg total) by mouth every 6 (six) hours. What changed:  medication strength how much to take when to take this   valACYclovir 500 MG tablet Commonly known as: VALTREX Take 500 mg by mouth 2 (two) times daily.         Discharge home in stable condition Infant Feeding: Bottle and Breast Infant Disposition:based on infant's weight and late preterm status, infant may be required to remain in-patient. Mother agrees to rooming in.  Discharge instruction: per After Visit Summary and Postpartum booklet. Activity: Advance as tolerated. Pelvic rest for 6 weeks.  Diet: routine diet Anticipated Birth Control: Unsure, options discussed Postpartum Appointment:6 weeks Additional Postpartum F/U:  none Future Appointments:No future appointments. Follow up Visit:  Follow-up Information     [facility] Follow up in 6 week(s).   Specialty: Obstetrics and Gynecology Contact information: [address] [place] [address] [phone]                    07/21/2023 Roma Schanz, CNM

## 2023-07-21 NOTE — Discharge Instructions (Signed)
 WHAT TO LOOK OUT FOR: Fever of 100.4 or above Mastitis: feels like flu and breasts hurt Infection: increased pain, swelling or redness Blood clots golf ball size or larger Postpartum depression   Congratulations on your newest addition!

## 2023-07-21 NOTE — Lactation Note (Signed)
 This note was copied from a baby's chart. Lactation Consultation Note  Patient Name: Kristy Davis [id number] Date: 07/21/2023 Age:35 hours Reason for consult: Follow-up assessment;1st time breastfeeding;Infant < 6lbs MOB informed LC infant recently BF for 30 minutes  at 2226 pm , then attempted supplemented infant but infant was not interested in bottle feeding at that time. LC reviewed LPTI/ LBW infant feeding policy. MOB will limit breast feeding to 15 minutes and then supplement infant with any EBM that is pumped and offer 22 kcal formula for every feeding. MOB knows on Day 2 to offer (15-18 mls) or more per feeding. MOB will continue to feed infant every 3 hours. MOB is now expressing 5 mls when hand expressing or using the DEBP. MOB knows to call RN/LC if she has any breastfeeding questions, concerns of need latch assistance.   Maternal Data    Feeding Mother's Current Feeding Choice: Breast Milk and Formula Nipple Type: Nfant Standard Flow (white)  LATCH Score                    Lactation Tools Discussed/Used    Interventions Interventions: Expressed milk;DEBP;Education;LPT handout/interventions  Discharge    Consult Status Consult Status: Follow-up Date: 07/21/23 Follow-up type: In-patient    Frederico Hamman 07/21/2023, 12:05 AM

## 2023-07-21 NOTE — Lactation Note (Signed)
 This note was copied from a baby's chart. Lactation Consultation Note  Patient Name: Kristy Davis [id number] Date: 07/21/2023 Age:34 hours  Reason for consult: Infant < 5lbs;Term;Infant weight loss;Follow-up assessment  P2, [redacted]w[redacted]d, 5.3% weight loss, SGA  Follow up LC visit with P2 mother and baby Kristy "Kaizer". Mother reports that her was rooting towards her breast last night and latched well for a few minutes. She places him to breast prior to bottle feeding but most of the time, "he just sleeps" so she stops and bottle feeds. Praised mother for being attentive to infant's feeding needs. Mother has been pumping and expressing 10 ml. Mother has expressed on her bedside table that is >6 hrs old and will need to be discarded. Advised to feed to baby with the the 4 hr period or refrigerate and warm with feeding.  Mother verbalized understanding.  Discussed attempting to feed baby 20 ml per feeding and gradually increasing as baby tolerates. Mother will continue to pump to stimulate her milk production and provide supplementation. She will resume already established feeding plan.   SLP is also consulting with baby/mother regarding feeding.   Feeding Mother's Current Feeding Choice: Breast Milk and Formula Nipple Type: Nfant Standard Flow (white)  Lactation Tools Discussed/Used Tools: Pump;Flanges Reason for Pumping: LBW, supplementing, not latching consistently Pumping frequency: every 3 hrs for 15 min in initial setting Pumped volume: 10 mL  Interventions Interventions: Breast feeding basics reviewed;Education   Consult Status Consult Status: Follow-up Date: 07/22/23 Follow-up type: In-patient    Christella Hartigan M 07/21/2023, 12:08 PM

## 2023-07-22 ENCOUNTER — Ambulatory Visit (HOSPITAL_COMMUNITY): Payer: Self-pay

## 2023-07-22 NOTE — Lactation Note (Addendum)
 This note was copied from a baby's chart. Lactation Consultation Note  Patient Name: Kristy Davis [id number] Date: 07/22/2023 Age:35 hours     Lactation Consultation Note   Patient Name: Kristy Davis [id number] Date: 07/22/2023 Age:58 hours   Reason for consult: Follow-up assessment;Term;Infant < 5lbs;Other (Comment) (SGA)   P2, [redacted]w[redacted]d, 4.8% weight loss   Follow up LC visit. Mother is pumping and expressing 60 ml of colostrum. Her breast are full and soften after pumping. She said baby has latched a few times but he gets sleepy. Mother plans to continue to work on latching once home and will continue to pump. She is an experienced breastfeeding mother.    She has swollen axillary tissue in her right arm pit that is tender and the left axillary area has slight edema and non-tender. Discussed how milk can drain into the armpit from her breast. Advised to manage with frequent feedings, pumping to soften and drain milk (avoiding milk to back up and breast to become engorged), apply ice and cabbage leaves only to the armpit to reduce edema and inflammation.    Discussed management of engorgement and signs/symptoms of mastitis that she needs to report to her OB provider. Mother was also informed of OP LC services and how to contact them.      Maternal Data Has patient been taught Hand Expression?: Yes Does the patient have breastfeeding experience prior to this delivery?: No   Feeding Mother's Current Feeding Choice: Breast Milk and Formula   LATCH Score Latch: Grasps breast easily, tongue down, lips flanged, rhythmical sucking.   Audible Swallowing: A few with stimulation   Type of Nipple: Everted at rest and after stimulation   Comfort (Breast/Nipple): Soft / non-tender   Hold (Positioning): No assistance needed to correctly position infant at breast.   LATCH Score: 9     Interventions Interventions: Breast feeding basics reviewed;Education   Discharge Discharge  Education: Engorgement and breast care;Warning signs for feeding baby;Outpatient recommendation   Consult Status Consult Status: Complete       Omar Person 07/22/2023, 11:01 AM

## 2023-07-30 ENCOUNTER — Telehealth (HOSPITAL_COMMUNITY): Payer: Self-pay | Admitting: *Deleted

## 2023-07-30 NOTE — Telephone Encounter (Signed)
 Attempted hospital discharge follow-up call. Left message for patient to return RN call with any questions or concerns. Julien Odor, RN, 07/30/23, [phone]
